# Patient Record
Sex: Female | Born: 1950
Health system: Southern US, Community
[De-identification: ages and names within clinical notes are randomized; demographics above are authoritative.]

## PROBLEM LIST (undated history)

## (undated) DIAGNOSIS — E785 Hyperlipidemia, unspecified: Secondary | ICD-10-CM

## (undated) DIAGNOSIS — M8580 Other specified disorders of bone density and structure, unspecified site: Secondary | ICD-10-CM

## (undated) DIAGNOSIS — E559 Vitamin D deficiency, unspecified: Secondary | ICD-10-CM

## (undated) DIAGNOSIS — R7303 Prediabetes: Secondary | ICD-10-CM

## (undated) DIAGNOSIS — E039 Hypothyroidism, unspecified: Secondary | ICD-10-CM

## (undated) HISTORY — DX: Hyperlipidemia, unspecified: E78.5

## (undated) HISTORY — PX: CATARACT EXTRACTION: SUR2

---
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
## 1986-10-18 HISTORY — PX: CHOLECYSTECTOMY: SHX55

## 2016-08-26 ENCOUNTER — Ambulatory Visit (INDEPENDENT_AMBULATORY_CARE_PROVIDER_SITE_OTHER): Payer: Medicare Other | Admitting: Family Medicine

## 2016-08-26 ENCOUNTER — Encounter: Payer: Self-pay | Admitting: Family Medicine

## 2016-08-26 VITALS — BP 122/76 | HR 70 | Ht 59.0 in | Wt 156.1 lb

## 2016-08-26 DIAGNOSIS — Z7689 Persons encountering health services in other specified circumstances: Secondary | ICD-10-CM | POA: Diagnosis not present

## 2016-08-26 DIAGNOSIS — Z8249 Family history of ischemic heart disease and other diseases of the circulatory system: Secondary | ICD-10-CM | POA: Insufficient documentation

## 2016-08-26 DIAGNOSIS — Z780 Asymptomatic menopausal state: Secondary | ICD-10-CM | POA: Diagnosis not present

## 2016-08-26 DIAGNOSIS — Z789 Other specified health status: Secondary | ICD-10-CM | POA: Diagnosis not present

## 2016-08-26 NOTE — Progress Notes (Signed)
Chief Complaint  Patient presents with  . New Patient (Initial Visit)    establishing care   65 year old woman Here because she turned 24 and medicare requires that she have a PCP Has not been to a doctor since she had her gallbladder removed in 1987. Says she had screening labs " at a van at [employer] in 2006" that were normal NEVER HAD: Pap smear, mammogram, colonoscopy Sees eye doctor infrequently, goes to dentist if she has a problem REFUSES immunization and does not recall her last shot She eats well, limited carbs and fried foods No reg exercise Never married, lives in family home with her brother her whole life.    Patient Active Problem List   Diagnosis Date Noted  . Health maintenance alteration 08/26/2016  . Family history of premature coronary heart disease 08/26/2016    No outpatient encounter prescriptions on file as of 08/26/2016.   No facility-administered encounter medications on file as of 08/26/2016.    History reviewed. No pertinent past medical history.  Past Surgical History:  Procedure Laterality Date  . CHOLECYSTECTOMY  10/1986    Social History   Social History  . Marital status: Single    Spouse name: N/A  . Number of children: 0  . 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 Smoking status: Never Smoker  . Smokeless tobacco: Never Used  . Alcohol use No  . Drug use: No  . Sexual activity: No     Comment: Never   Other Topics Concern  . Not on file   Social History Narrative   Never married   Lives with brother [name]       Family History  Problem Relation Age of Onset  . Atrial fibrillation Mother   . CVA Mother   . Stroke Mother   . Heart attack Father   . Heart disease Father 78    heart attack  . Other Brother     enlarge prostate  . Cancer Paternal Grandmother      head and neck    Review of Systems  Constitutional: Negative for chills, fever and weight loss.  HENT: Negative for congestion and hearing loss.   Eyes: Negative for blurred vision and pain.  Respiratory: Negative for cough and shortness of breath.   Cardiovascular: Negative for chest pain and leg swelling.  Gastrointestinal: Negative for abdominal pain, constipation, diarrhea and heartburn.  Genitourinary: Negative for dysuria and frequency.  Musculoskeletal: Negative for falls, joint pain and myalgias.  Neurological: Negative for dizziness, seizures and headaches.  Psychiatric/Behavioral: Negative for depression. The patient is not nervous/anxious and does not have insomnia.     BP 122/76 (BP Location: Left Arm, Patient Position: Sitting, Cuff Size: Large)   Pulse 70   Ht 4\' 11"  (1.499 m)   Wt 156 lb 1.9 oz (70.8 kg)   LMP  (Exact Date)   SpO2 94%   BMI 31.53 kg/m   Physical Exam  Constitutional: She is oriented to person, place, and time. She appears well-developed and well-nourished.  HENT:  Head: Normocephalic and atraumatic.  Right Ear: External ear normal.  Left Ear: External ear normal.  Mouth/Throat: Oropharynx is clear and moist.  Eyes: Conjunctivae are normal. Pupils are equal, round, and reactive to light.  Neck: Normal range of motion.  Neck supple. No thyromegaly present.  Cardiovascular: Normal rate, regular rhythm, normal heart sounds and intact distal pulses.   Pulmonary/Chest: Effort normal and breath sounds normal. No respiratory distress.  Abdominal: Soft. Bowel sounds are normal.  Musculoskeletal: Normal range of motion. She exhibits no edema.  Lymphadenopathy:    She has no cervical adenopathy.  Neurological: She is alert and oriented to person, place, and time. She has normal reflexes.  Gait normal  Skin: Skin is warm and dry.  Psychiatric: She has a normal mood and affect. Her behavior is normal. Thought content normal.  Nursing note and vitals  reviewed.  ASSESSMENT/PLAN:   1. Encounter to establish care with new doctor - COMPLETE METABOLIC PANEL WITH GFR - Lipid panel - VITAMIN D 25 Hydroxy (Vit-D Deficiency, Fractures)  2. Family history of premature coronary heart disease   3. Post-menopause   4. Health maintenance alteration  Discussed recommended health evaluation by the USPSTF guidelines and offered mammogram, colonoscopy, immunization update. She agrees only to labs today.     Patient Instructions  Lab tests today I will send you a letter with your test results.  If there is anything of concern, we will call right away.  Continue to eat well and get plenty of exercise Consider walking daily  See me for any problems or illness   Raylene Everts, MD

## 2016-08-26 NOTE — Patient Instructions (Signed)
Lab tests today I will send you a letter with your test results.  If there is anything of concern, we will call right away.  Continue to eat well and get plenty of exercise Consider walking daily  See me for any problems or illness

## 2016-08-27 LAB — COMPLETE METABOLIC PANEL WITH GFR
ALT: 16 U/L (ref 6–29)
AST: 19 U/L (ref 10–35)
Albumin: 3.8 g/dL (ref 3.6–5.1)
Alkaline Phosphatase: 53 U/L (ref 33–130)
BILIRUBIN TOTAL: 0.3 mg/dL (ref 0.2–1.2)
BUN: 12 mg/dL (ref 7–25)
CHLORIDE: 106 mmol/L (ref 98–110)
CO2: 27 mmol/L (ref 20–31)
CREATININE: 0.91 mg/dL (ref 0.50–0.99)
Calcium: 9.4 mg/dL (ref 8.6–10.4)
GFR, EST AFRICAN AMERICAN: 77 mL/min (ref >=60)
GFR, Est Non African American: 66 mL/min (ref >=60)
Glucose, Bld: 93 mg/dL (ref 65–99)
Potassium: 5.3 mmol/L (ref 3.5–5.3)
Sodium: 142 mmol/L (ref 135–146)
TOTAL PROTEIN: 6.8 g/dL (ref 6.1–8.1)

## 2016-08-27 LAB — LIPID PANEL
Cholesterol: 220 mg/dL — ABNORMAL HIGH (ref 125–200)
HDL: 43 mg/dL — ABNORMAL LOW (ref >=46)
LDL CALC: 127 mg/dL (ref <130)
TRIGLYCERIDES: 251 mg/dL — AB (ref <150)
Total CHOL/HDL Ratio: 5.1 Ratio — ABNORMAL HIGH (ref <=5.0)
VLDL: 50 mg/dL — AB (ref <30)

## 2016-08-28 ENCOUNTER — Encounter: Payer: Self-pay | Admitting: Family Medicine

## 2016-08-28 LAB — VITAMIN D 25 HYDROXY (VIT D DEFICIENCY, FRACTURES): VIT D 25 HYDROXY: 32 ng/mL (ref 30–100)

## 2017-03-31 ENCOUNTER — Ambulatory Visit: Payer: Medicare Other

## 2017-04-03 ENCOUNTER — Encounter: Payer: Self-pay | Admitting: Family Medicine

## 2017-04-03 ENCOUNTER — Ambulatory Visit (INDEPENDENT_AMBULATORY_CARE_PROVIDER_SITE_OTHER): Payer: Medicare Other | Admitting: Family Medicine

## 2017-04-03 VITALS — BP 146/80 | HR 80 | Temp 98.1°F | Resp 16 | Ht 59.0 in | Wt 163.0 lb

## 2017-04-03 DIAGNOSIS — Z1211 Encounter for screening for malignant neoplasm of colon: Secondary | ICD-10-CM

## 2017-04-03 DIAGNOSIS — Z780 Asymptomatic menopausal state: Secondary | ICD-10-CM

## 2017-04-03 DIAGNOSIS — Z23 Encounter for immunization: Secondary | ICD-10-CM

## 2017-04-03 DIAGNOSIS — Z1239 Encounter for other screening for malignant neoplasm of breast: Secondary | ICD-10-CM

## 2017-04-03 DIAGNOSIS — Z1231 Encounter for screening mammogram for malignant neoplasm of breast: Secondary | ICD-10-CM | POA: Diagnosis not present

## 2017-04-03 DIAGNOSIS — Z0000 Encounter for general adult medical examination without abnormal findings: Secondary | ICD-10-CM | POA: Diagnosis not present

## 2017-04-03 NOTE — Patient Instructions (Addendum)
Health Maintenance for Postmenopausal Women Menopause is a normal process in which your reproductive ability comes to an end. This process happens gradually over a span of months to years, usually between the ages of 58 and 40. Menopause is complete when you have missed 12 consecutive menstrual periods. It is important to talk with your health care provider about some of the most common conditions that affect postmenopausal women, such as heart disease, cancer, and bone loss (osteoporosis). Adopting a healthy lifestyle and getting preventive care can help to promote your health and wellness. Those actions can also lower your chances of developing some of these common conditions  What should I know about heart disease and stroke? Heart disease, heart attack, and stroke become more likely as you age. This may be due, in part, to the hormonal changes that your body experiences during menopause. These can affect how your body processes dietary fats, triglycerides, and cholesterol. Heart attack and stroke are both medical emergencies. There are many things that you can do to help prevent heart disease and stroke:  Have your blood pressure checked at least every 1-2 years. High blood pressure causes heart disease and increases the risk of stroke.  If you are 69-4 years old, ask your health care provider if you should take aspirin to prevent a heart attack or a stroke.  Do not use any tobacco products, including cigarettes, chewing tobacco, or electronic cigarettes. If you need help quitting, ask your health care provider.  It is important to eat a healthy diet and maintain a healthy weight.  Be sure to include plenty of vegetables, fruits, low-fat dairy products, and lean protein.  Avoid eating foods that are high in solid fats, added sugars, or salt (sodium).  Get regular exercise. This is one of the most important things that you can do for your health.  Try to exercise for at least 150 minutes  each week. The type of exercise that you do should increase your heart rate and make you sweat. This is known as moderate-intensity exercise.  Try to do strengthening exercises at least twice each week. Do these in addition to the moderate-intensity exercise.  Know your numbers.Ask your health care provider to check your cholesterol and your blood glucose. Continue to have your blood tested as directed by your health care provider. What should I know about cancer screening? There are several types of cancer. Take the following steps to reduce your risk and to catch any cancer development as early as possible. Breast Cancer  Practice breast self-awareness.  This means understanding how your breasts normally appear and feel.  It also means doing regular breast self-exams. Let your health care provider know about any changes, no matter how small.  If you are 81 or older, have a clinician do a breast exam (clinical breast exam or CBE) every year. Depending on your age, family history, and medical history, it may be recommended that you also have a yearly breast X-ray (mammogram).  If you have a family history of breast cancer, talk with your health care provider about genetic screening.  If you are at high risk for breast cancer, talk with your health care provider about having an MRI and a mammogram every year. Colorectal Cancer  This type of cancer can be detected and can often be prevented.  Routine colorectal cancer screening usually begins at age 82 and continues through age 24.  If you have risk factors for colon cancer, your health care provider may recommend  that you be screened at an earlier age.  If you have a family history of colorectal cancer, talk with your health care provider about genetic screening.  Your health care provider may also recommend using home test kits to check for hidden blood in your stool.  A small camera at the end of a tube can be used to examine your  colon directly (sigmoidoscopy or colonoscopy). This is done to check for the earliest forms of colorectal cancer.  Direct examination of the colon should be repeated every 5-10 years until age 19. However, if early forms of precancerous polyps or small growths are found or if you have a family history or genetic risk for colorectal cancer, you may need to be screened more often. Skin Cancer  Check your skin from head to toe regularly.  Monitor any moles. Be sure to tell your health care provider:  About any new moles or changes in moles, especially if there is a change in a mole's shape or color.  If you have a mole that is larger than the size of a pencil eraser.  If any of your family members has a history of skin cancer, especially at a young age, talk with your health care provider about genetic screening.  Always use sunscreen. Apply sunscreen liberally and repeatedly throughout the day.  Whenever you are outside, protect yourself by wearing long sleeves, pants, a wide-brimmed hat, and sunglasses. What should I know about osteoporosis? Osteoporosis is a condition in which bone destruction happens more quickly than new bone creation. After menopause, you may be at an increased risk for osteoporosis. To help prevent osteoporosis or the bone fractures that can happen because of osteoporosis, the following is recommended:  If you are 25-53 years old, get at least 1,000 mg of calcium and at least 600 mg of vitamin D per day.  If you are older than age 93 but younger than age 61, get at least 1,200 mg of calcium and at least 600 mg of vitamin D per day.  If you are older than age 50, get at least 1,200 mg of calcium and at least 800 mg of vitamin D per day. Smoking and excessive alcohol intake increase the risk of osteoporosis. Eat foods that are rich in calcium and vitamin D, and do weight-bearing exercises several times each week as directed by your health care provider. What should I  know about immunizations? It is important that you get and maintain your immunizations. These include:  Tetanus, diphtheria, and pertussis (Tdap) booster vaccine.  Influenza every year before the flu season begins.  Pneumonia vaccine.  Shingles vaccine.

## 2017-04-03 NOTE — Progress Notes (Signed)
Subjective:    Kaitlin Jones is a 66 y.o. female who presents for a Welcome to Medicare exam.   Review of Systems  Review of Systems  Constitutional: Negative for malaise/fatigue and weight loss.  HENT: Positive for congestion. Negative for hearing loss and sinus pain.        Dental fractures.  Pollen allergies  Eyes: Positive for blurred vision. Negative for double vision.  Respiratory: Positive for cough. Negative for sputum production and shortness of breath.        From PND  Cardiovascular: Negative for chest pain, palpitations, claudication and leg swelling.  Gastrointestinal: Positive for heartburn. Negative for blood in stool, constipation and diarrhea.       Once in a while  Genitourinary: Negative for dysuria and hematuria.  Musculoskeletal: Negative for back pain, joint pain and myalgias.  Neurological: Positive for dizziness. Negative for headaches.       Had a couple of dizzy (spinning spells)  Endo/Heme/Allergies: Positive for environmental allergies. Does not bruise/bleed easily.  Psychiatric/Behavioral: Negative for depression. The patient has insomnia. The patient is not nervous/anxious.       Objective:    Today's Vitals   04/03/17 1551  BP: (!) 146/80  Pulse: 80  Resp: 16  Temp: 98.1 F (36.7 C)  TempSrc: Temporal  SpO2: 96%  Weight: 163 lb 0.6 oz (74 kg)  Height: 4\' 11"  (1.499 m)  PainSc: 0-No pain  Body mass index is 32.93 kg/m.  Medications No outpatient encounter prescriptions on file as of 04/03/2017.   No facility-administered encounter medications on file as of 04/03/2017.      History: Past Medical History:  Diagnosis Date  . Hyperlipidemia    Past Surgical History:  Procedure Laterality Date  . CHOLECYSTECTOMY  10/1986    Family History  Problem Relation Age of Onset  . Atrial fibrillation Mother   . CVA Mother   . Stroke Mother   . Heart attack Father   . Heart disease Father 66       heart attack  . Other Brother    enlarge prostate  . Cancer Paternal Grandmother        head and neck   Social History   Occupational History  . nursing   . computers    Social History Main Topics  . Smoking status: Never Smoker  . Smokeless tobacco: Never Used  . Alcohol use No  . Drug use: No  . Sexual activity: No     Comment: Never    Tobacco Counseling Counseling given: Not Answered No tobacco use  Immunizations and Health Maintenance; immunizations refused  There is no immunization history on file for this patient. Health Maintenance Due  Topic Date Due  . Hepatitis C Screening  [birth year]  . HIV Screening  08/25/1966  . TETANUS/TDAP  08/25/1970  . PAP SMEAR  08/25/1972  . MAMMOGRAM  08/25/2001  . COLONOSCOPY  08/25/2001  . DEXA SCAN  08/25/2016  . PNA vac Low Risk Adult (1 of 2 - PCV13) 08/25/2016    Activities of Daily Living In your present state of health, do you have any difficulty performing the following activities: 04/03/2017  Hearing? N  Vision? Y  Difficulty concentrating or making decisions? N  Walking or climbing stairs? N  Dressing or bathing? N  Doing errands, shopping? N    Physical Exam  General appearance: cooperative and appears stated age Head: Normocephalic, without obvious abnormality, atraumatic Eyes: negative findings: lids and lashes normal and conjunctivae  and sclerae normal Ears: normal TM's and external ear canals both ears Nose: Nares normal. Septum midline. Mucosa normal. No drainage or sinus tenderness., no discharge Throat: normal findings: lips normal without lesions, buccal mucosa normal, oropharynx pink & moist without lesions or evidence of thrush and dental caries.  defect in mucosa with "hole" behind last upper molar on the right. Neck: no adenopathy and thyroid not enlarged, symmetric, no tenderness/mass/nodules Lungs: clear to auscultation bilaterally Heart: regular rate and rhythm Extremities: extremities normal, atraumatic, no cyanosis or  edema(  Advanced Directives:  would like attempt of CPR , does not want intubation or to be "Kept alive on machines, like a ventilator or feeding tube"    Assessment:    This is a routine wellness examination for this patient . Yes.  Not up to date with health testing due to previous refusal.  Vision/Hearing screen  Visual Acuity Screening   Right eye Left eye Both eyes  Without correction:     With correction: 20/20 20/40 20/20     Dietary issues and exercise activities discussed:   cereal - oatmeal - smoothie  Every day  NO exercise. 30 min of exercise daily recommended Balanced diet, diet sheet given to patient  Goals    . walk every day      Depression Screen PHQ 2/9 Scores 04/03/2017  PHQ - 2 Score 0     Fall Risk Fall Risk  04/03/2017  Falls in the past year? Yes  Number falls in past yr: 1  Injury with Fall? No    Cognitive Function:    normal memory - 3 recall and clock    Patient Care Team: Raylene Everts, MD as PCP - General (Family Medicine)     Plan:  1. Encounter for Medicare annual wellness exam  - MM Digital Screening; Future - DG Bone Density; Future - Cologuard  2. Screening for breast cancer   3. Screen for colon cancer   4.Need for pneumococcal vaccination prevnar 13 administered. Go to pharmacy for shingles Return in the fall for flu  5. Post-menopausal   I have personally reviewed and noted the following in the patient's chart:   . Medical and social history . Use of alcohol, tobacco or illicit drugs  . Current medications and supplements . Functional ability and status . Nutritional status . Physical activity . Advanced directives . List of other physicians . Hospitalizations, surgeries, and ER visits in previous 12 months . Vitals . Screenings to include cognitive, depression, and falls . Referrals and appointments  In addition, I have reviewed and discussed with patient certain preventive protocols, quality  metrics, and best practice recommendations. A written personalized care plan for preventive services as well as general preventive health recommendations were provided to patient.     Ova Freshwater, LPN 0/07/2329 Raylene Everts, MD

## 2017-04-07 ENCOUNTER — Telehealth: Payer: Self-pay | Admitting: Family Medicine

## 2017-04-07 NOTE — Telephone Encounter (Signed)
Left message w/ Shanon Brow - patients brother per [place] they would like to speak w/ her to schedule her mammo/bone density because we do not have information previous to oct of this year about her last mammo.  She is aware that she can call [phone] to schedule.

## 2017-04-15 DIAGNOSIS — Z1211 Encounter for screening for malignant neoplasm of colon: Secondary | ICD-10-CM | POA: Diagnosis not present

## 2017-04-15 DIAGNOSIS — Z1212 Encounter for screening for malignant neoplasm of rectum: Secondary | ICD-10-CM | POA: Diagnosis not present

## 2017-04-17 ENCOUNTER — Ambulatory Visit (HOSPITAL_COMMUNITY)
Admission: RE | Admit: 2017-04-17 | Discharge: 2017-04-17 | Disposition: A | Discharge status: Home / Self Care | Payer: Medicare Other | Source: Ambulatory Visit | Attending: Family Medicine | Admitting: Family Medicine

## 2017-04-17 DIAGNOSIS — Z0000 Encounter for general adult medical examination without abnormal findings: Secondary | ICD-10-CM

## 2017-04-17 DIAGNOSIS — M85851 Other specified disorders of bone density and structure, right thigh: Secondary | ICD-10-CM | POA: Insufficient documentation

## 2017-04-17 DIAGNOSIS — Z1231 Encounter for screening mammogram for malignant neoplasm of breast: Secondary | ICD-10-CM | POA: Insufficient documentation

## 2017-04-18 ENCOUNTER — Encounter: Payer: Self-pay | Admitting: Family Medicine

## 2017-04-21 ENCOUNTER — Telehealth: Payer: Self-pay

## 2017-04-21 NOTE — Telephone Encounter (Signed)
No.  We do lyme tests for symptoms only

## 2017-04-21 NOTE — Telephone Encounter (Signed)
Called Kaitlin Jones, aware of negative cologuard results. Also asking if she should have a lyme test as she had a tick in her scalp?

## 2017-04-22 NOTE — Telephone Encounter (Signed)
Called [place], aware.

## 2017-04-23 ENCOUNTER — Other Ambulatory Visit: Payer: Self-pay | Admitting: Family Medicine

## 2017-04-23 DIAGNOSIS — R928 Other abnormal and inconclusive findings on diagnostic imaging of breast: Secondary | ICD-10-CM

## 2017-04-29 ENCOUNTER — Ambulatory Visit (HOSPITAL_COMMUNITY)
Admission: RE | Admit: 2017-04-29 | Discharge: 2017-04-29 | Disposition: A | Discharge status: Home / Self Care | Payer: Medicare Other | Source: Ambulatory Visit | Attending: Family Medicine | Admitting: Family Medicine

## 2017-04-29 DIAGNOSIS — R928 Other abnormal and inconclusive findings on diagnostic imaging of breast: Secondary | ICD-10-CM

## 2017-04-29 DIAGNOSIS — N6489 Other specified disorders of breast: Secondary | ICD-10-CM | POA: Diagnosis not present

## 2017-06-03 DIAGNOSIS — H5203 Hypermetropia, bilateral: Secondary | ICD-10-CM | POA: Diagnosis not present

## 2017-06-03 DIAGNOSIS — H524 Presbyopia: Secondary | ICD-10-CM | POA: Diagnosis not present

## 2017-09-01 ENCOUNTER — Telehealth: Payer: Self-pay

## 2017-09-01 NOTE — Telephone Encounter (Signed)
Phone note made in error ?

## 2017-09-01 NOTE — Telephone Encounter (Signed)
Routing to Susan. 

## 2017-09-01 NOTE — Telephone Encounter (Signed)
Patient called asking to speak to [facility] or one of the other doctors. I told her that everyone was with patients and I could take a message. She said she had questions about a bill and how it was coded and someone needed to call her at [phone]

## 2017-09-11 ENCOUNTER — Ambulatory Visit (INDEPENDENT_AMBULATORY_CARE_PROVIDER_SITE_OTHER): Payer: Medicare Other

## 2017-09-11 DIAGNOSIS — Z23 Encounter for immunization: Secondary | ICD-10-CM

## 2017-10-30 ENCOUNTER — Telehealth: Payer: Self-pay | Admitting: Family Medicine

## 2017-10-30 NOTE — Telephone Encounter (Signed)
Pt stopped in to get there [employer] and I advised per [place] that we need to order some and when they were available we would call them to schedule.

## 2017-12-29 ENCOUNTER — Telehealth: Payer: Self-pay | Admitting: Family Medicine

## 2017-12-29 NOTE — Telephone Encounter (Signed)
Called Kaitlin Jones, will be coming in Thursday.

## 2017-12-29 NOTE — Telephone Encounter (Signed)
Patient called in to ask if she can get her 2nd shingles shot Cb#: [phone]

## 2018-01-01 ENCOUNTER — Ambulatory Visit (INDEPENDENT_AMBULATORY_CARE_PROVIDER_SITE_OTHER): Payer: Medicare Other

## 2018-01-01 DIAGNOSIS — Z23 Encounter for immunization: Secondary | ICD-10-CM

## 2018-01-01 NOTE — Progress Notes (Signed)
Injection given with no complications 

## 2018-04-23 ENCOUNTER — Encounter: Payer: Self-pay | Admitting: Family Medicine

## 2018-04-24 ENCOUNTER — Encounter: Payer: Self-pay | Admitting: Family Medicine

## 2018-06-09 DIAGNOSIS — H2513 Age-related nuclear cataract, bilateral: Secondary | ICD-10-CM | POA: Diagnosis not present

## 2018-07-22 DIAGNOSIS — R5383 Other fatigue: Secondary | ICD-10-CM | POA: Diagnosis not present

## 2018-07-22 DIAGNOSIS — E559 Vitamin D deficiency, unspecified: Secondary | ICD-10-CM | POA: Diagnosis not present

## 2018-07-22 DIAGNOSIS — E785 Hyperlipidemia, unspecified: Secondary | ICD-10-CM | POA: Diagnosis not present

## 2018-07-22 DIAGNOSIS — M8580 Other specified disorders of bone density and structure, unspecified site: Secondary | ICD-10-CM | POA: Diagnosis not present

## 2018-07-22 DIAGNOSIS — E119 Type 2 diabetes mellitus without complications: Secondary | ICD-10-CM | POA: Diagnosis not present

## 2018-07-22 DIAGNOSIS — Z131 Encounter for screening for diabetes mellitus: Secondary | ICD-10-CM | POA: Diagnosis not present

## 2018-09-07 DIAGNOSIS — E785 Hyperlipidemia, unspecified: Secondary | ICD-10-CM | POA: Diagnosis not present

## 2018-09-07 DIAGNOSIS — E039 Hypothyroidism, unspecified: Secondary | ICD-10-CM | POA: Diagnosis not present

## 2018-10-12 DIAGNOSIS — E039 Hypothyroidism, unspecified: Secondary | ICD-10-CM | POA: Diagnosis not present

## 2018-10-12 DIAGNOSIS — R7303 Prediabetes: Secondary | ICD-10-CM | POA: Diagnosis not present

## 2018-11-25 DIAGNOSIS — E030 Congenital hypothyroidism with diffuse goiter: Secondary | ICD-10-CM | POA: Diagnosis not present

## 2018-11-25 DIAGNOSIS — R7303 Prediabetes: Secondary | ICD-10-CM | POA: Diagnosis not present

## 2018-11-25 DIAGNOSIS — E039 Hypothyroidism, unspecified: Secondary | ICD-10-CM | POA: Diagnosis not present

## 2018-11-25 DIAGNOSIS — E785 Hyperlipidemia, unspecified: Secondary | ICD-10-CM | POA: Diagnosis not present

## 2018-11-25 DIAGNOSIS — M8580 Other specified disorders of bone density and structure, unspecified site: Secondary | ICD-10-CM | POA: Diagnosis not present

## 2019-01-29 ENCOUNTER — Encounter (INDEPENDENT_AMBULATORY_CARE_PROVIDER_SITE_OTHER): Payer: Self-pay | Admitting: Nurse Practitioner

## 2019-03-03 ENCOUNTER — Ambulatory Visit (INDEPENDENT_AMBULATORY_CARE_PROVIDER_SITE_OTHER): Payer: Medicare Other | Admitting: Nurse Practitioner

## 2019-04-13 ENCOUNTER — Other Ambulatory Visit (HOSPITAL_COMMUNITY): Payer: Self-pay | Admitting: Nurse Practitioner

## 2019-04-13 DIAGNOSIS — IMO0002 Reserved for concepts with insufficient information to code with codable children: Secondary | ICD-10-CM

## 2019-04-13 DIAGNOSIS — Z1231 Encounter for screening mammogram for malignant neoplasm of breast: Secondary | ICD-10-CM

## 2019-05-04 ENCOUNTER — Ambulatory Visit (HOSPITAL_COMMUNITY)
Admission: RE | Admit: 2019-05-04 | Discharge: 2019-05-04 | Disposition: A | Discharge status: Home / Self Care | Payer: Medicare Other | Source: Ambulatory Visit | Attending: Nurse Practitioner | Admitting: Nurse Practitioner

## 2019-05-04 ENCOUNTER — Ambulatory Visit (HOSPITAL_COMMUNITY): Payer: Medicare Other

## 2019-05-04 ENCOUNTER — Other Ambulatory Visit: Payer: Self-pay

## 2019-05-04 DIAGNOSIS — IMO0002 Reserved for concepts with insufficient information to code with codable children: Secondary | ICD-10-CM

## 2019-05-04 DIAGNOSIS — R928 Other abnormal and inconclusive findings on diagnostic imaging of breast: Secondary | ICD-10-CM | POA: Diagnosis not present

## 2019-08-10 ENCOUNTER — Encounter (INDEPENDENT_AMBULATORY_CARE_PROVIDER_SITE_OTHER): Payer: Self-pay | Admitting: Internal Medicine

## 2019-08-10 ENCOUNTER — Other Ambulatory Visit: Payer: Self-pay

## 2019-08-10 ENCOUNTER — Ambulatory Visit (INDEPENDENT_AMBULATORY_CARE_PROVIDER_SITE_OTHER): Payer: Medicare Other | Admitting: Internal Medicine

## 2019-08-10 ENCOUNTER — Telehealth (INDEPENDENT_AMBULATORY_CARE_PROVIDER_SITE_OTHER): Payer: Self-pay | Admitting: Internal Medicine

## 2019-08-10 VITALS — BP 110/67 | HR 66 | Temp 97.4°F | Resp 18 | Ht 59.0 in | Wt 112.0 lb

## 2019-08-10 DIAGNOSIS — Z0001 Encounter for general adult medical examination with abnormal findings: Secondary | ICD-10-CM

## 2019-08-10 DIAGNOSIS — R5381 Other malaise: Secondary | ICD-10-CM

## 2019-08-10 DIAGNOSIS — E782 Mixed hyperlipidemia: Secondary | ICD-10-CM

## 2019-08-10 DIAGNOSIS — Z1211 Encounter for screening for malignant neoplasm of colon: Secondary | ICD-10-CM

## 2019-08-10 DIAGNOSIS — M8580 Other specified disorders of bone density and structure, unspecified site: Secondary | ICD-10-CM

## 2019-08-10 DIAGNOSIS — E559 Vitamin D deficiency, unspecified: Secondary | ICD-10-CM | POA: Diagnosis not present

## 2019-08-10 DIAGNOSIS — E039 Hypothyroidism, unspecified: Secondary | ICD-10-CM

## 2019-08-10 DIAGNOSIS — R7303 Prediabetes: Secondary | ICD-10-CM | POA: Diagnosis not present

## 2019-08-10 DIAGNOSIS — Z1159 Encounter for screening for other viral diseases: Secondary | ICD-10-CM

## 2019-08-10 DIAGNOSIS — E785 Hyperlipidemia, unspecified: Secondary | ICD-10-CM | POA: Insufficient documentation

## 2019-08-10 DIAGNOSIS — R5383 Other fatigue: Secondary | ICD-10-CM

## 2019-08-10 HISTORY — DX: Vitamin D deficiency, unspecified: E55.9

## 2019-08-10 HISTORY — DX: Hypothyroidism, unspecified: E03.9

## 2019-08-10 HISTORY — DX: Hyperlipidemia, unspecified: E78.5

## 2019-08-10 HISTORY — DX: Prediabetes: R73.03

## 2019-08-10 HISTORY — DX: Other specified disorders of bone density and structure, unspecified site: M85.80

## 2019-08-10 NOTE — Progress Notes (Signed)
Chief Complaint: This 68 year old lady comes in for an annual physical exam and to address her chronic conditions which are described below. HPI: She has a history of hypothyroidism, hyperlipidemia, osteopenia, prediabetes and vitamin D deficiency. She takes desiccated thyroid for hypothyroidism without any problems. At one point, she was prediabetic but her last hemoglobin A1c was excellent at 5.3% but I tend to keep her surveillance on this. As far as her osteopenia is concerned, she walks only once a week and does not seem to do any weightbearing exercises.  Thankfully, she has not developed frank osteoporosis that we know of. She continues to take vitamin D3 supplementation for vitamin D deficiency. Thankfully, for her hyperlipidemia, she has not required statin therapy.  There is no history of coronary artery disease or cerebrovascular disease.  She denies any chest pain, dyspnea, palpitations or limb weakness.  Past Medical History:  Diagnosis Date  . HLD (hyperlipidemia) 08/10/2019  . Hyperlipidemia   . Hypothyroidism, adult 08/10/2019  . Osteopenia 08/10/2019  . Prediabetes 08/10/2019  . Vitamin D deficiency disease 08/10/2019   Past Surgical History:  Procedure Laterality Date  . CHOLECYSTECTOMY  10/1986     Social History   Social History Narrative   Never married   Lives with brother [name].   Retired Marine scientist.        Allergies: No Known Allergies   Current Meds  Medication Sig  . Cholecalciferol (VITAMIN D3) 50 MCG (2000 UT) TABS Take 2 tablets by mouth daily.  Marland Kitchen thyroid (NP THYROID) 60 MG tablet Take 1 tablet by mouth daily.     Nutrition She tries to eat healthy, although I am not sure how consistent this is.  Sleep Sleep is reasonable between 6 to 8 hours a night.  Exercise Walks 3 miles once a week.  [id number] from the symptoms mentioned above,there are no other symptoms referable to all systems reviewed.  Physical Exam: Blood pressure 110/67, pulse  66, temperature (!) 97.4 F (36.3 C), temperature source Temporal, resp. rate 18, height 4\' 11"  (1.499 m), weight 112 lb (50.8 kg), last menstrual period 11/18/2004, SpO2 99 %. Vitals with BMI 08/10/2019 04/03/2017 08/26/2016  Height 4\' 11"  4\' 11"  4\' 11"   Weight 112 lbs 163 lbs 1 oz 156 lbs 2 oz  BMI [id number]  Systolic [id number]  Diastolic 67 80 76  Pulse 66 80 70      She looks systemically well. General: Alert, cooperative, and appears to be the stated age.No pallor.  No jaundice.  No clubbing. Head: Normocephalic Eyes: Sclera white, pupils equal and reactive to light, red reflex x 2,  Ears: Normal bilaterally Oral cavity: Lips, mucosa, and tongue normal: Teeth and gums normal Neck: No adenopathy, supple, symmetrical, trachea midline, and thyroid does not appear enlarged Breast: No masses felt. Respiratory: Clear to auscultation bilaterally.No wheezing, crackles or bronchial breathing. Cardiovascular: Heart sounds are present and appear to be normal without murmurs or added sounds.  No carotid bruits.  Peripheral pulses are present and equal bilaterally.: Soft, nontender, Gastrointestinal:positive bowel sounds, no hepatosplenomegaly.  No masses felt.No tenderness. Skin: Clear, No rashes noted.No worrisome skin lesions seen. Neurological: Grossly intact without focal findings, cranial nerves II through XII intact, muscle strength equal bilaterally Musculoskeletal: No acute joint abnormalities noted.Full range of movement noted with joints. Psychiatric: Affect appropriate, non-anxious.    Assessment  1. Colon cancer screening   2. Hypothyroidism, adult   3. Vitamin D deficiency disease   4. Prediabetes  5. Mixed hyperlipidemia   6. Encounter for hepatitis C screening test for low risk patient   7. Osteopenia, unspecified location     Tests Ordered:  Orders Placed This Encounter  Procedures  . Ambulatory referral to Gastroenterology     Plan  1. She will continue  with all medications for chronic conditions above. 2. Healthy 68 year old for her age. 3. Blood work as ordered above. 4. I will refer her to gastroenterology for colonoscopy. 5. Further recommendations will depend on blood results and I will have a follow-up with her with Judson Roch in about 3 months time. 6. Today, in addition to a preventative visit, I performed an office visit to address her chronic conditions above.        Nimish C Gosrani   08/10/2019, 10:46 AM

## 2019-08-11 ENCOUNTER — Encounter (INDEPENDENT_AMBULATORY_CARE_PROVIDER_SITE_OTHER): Payer: Self-pay | Admitting: Internal Medicine

## 2019-08-11 LAB — COMPLETE METABOLIC PANEL WITH GFR
AG Ratio: 1.3 (calc) (ref 1.0–2.5)
ALT: 18 U/L (ref 6–29)
AST: 22 U/L (ref 10–35)
Albumin: 4 g/dL (ref 3.6–5.1)
Alkaline phosphatase (APISO): 76 U/L (ref 37–153)
BUN: 9 mg/dL (ref 7–25)
CO2: 27 mmol/L (ref 20–32)
Calcium: 9.8 mg/dL (ref 8.6–10.4)
Chloride: 105 mmol/L (ref 98–110)
Creat: 0.75 mg/dL (ref 0.50–0.99)
GFR, Est African American: 96 mL/min/{1.73_m2} (ref >=60)
GFR, Est Non African American: 82 mL/min/{1.73_m2} (ref >=60)
Globulin: 3.1 g/dL (calc) (ref 1.9–3.7)
Glucose, Bld: 75 mg/dL (ref 65–99)
Potassium: 3.8 mmol/L (ref 3.5–5.3)
Sodium: 143 mmol/L (ref 135–146)
Total Bilirubin: 0.3 mg/dL (ref 0.2–1.2)
Total Protein: 7.1 g/dL (ref 6.1–8.1)

## 2019-08-11 LAB — CBC
HCT: 41.2 % (ref 35.0–45.0)
Hemoglobin: 13.4 g/dL (ref 11.7–15.5)
MCH: 29.5 pg (ref 27.0–33.0)
MCHC: 32.5 g/dL (ref 32.0–36.0)
MCV: 90.7 fL (ref 80.0–100.0)
MPV: 10.5 fL (ref 7.5–12.5)
Platelets: 305 10*3/uL (ref 140–400)
RBC: 4.54 10*6/uL (ref 3.80–5.10)
RDW: 12 % (ref 11.0–15.0)
WBC: 6.9 10*3/uL (ref 3.8–10.8)

## 2019-08-11 LAB — HEMOGLOBIN A1C
Hgb A1c MFr Bld: 5 % of total Hgb (ref <5.7)
Mean Plasma Glucose: 97 (calc)
eAG (mmol/L): 5.4 (calc)

## 2019-08-11 LAB — HEPATITIS C ANTIBODY
Hepatitis C Ab: NONREACTIVE
SIGNAL TO CUT-OFF: 0.03 (ref <1.00)

## 2019-08-11 LAB — VITAMIN D 25 HYDROXY (VIT D DEFICIENCY, FRACTURES): Vit D, 25-Hydroxy: 97 ng/mL (ref 30–100)

## 2019-08-11 LAB — TSH: TSH: <0.01 mIU/L — ABNORMAL LOW (ref 0.40–4.50)

## 2019-08-11 LAB — LIPID PANEL
Cholesterol: 171 mg/dL (ref <200)
HDL: 55 mg/dL (ref >=50)
LDL Cholesterol (Calc): 96 mg/dL (calc)
Non-HDL Cholesterol (Calc): 116 mg/dL (calc) (ref <130)
Total CHOL/HDL Ratio: 3.1 (calc) (ref <5.0)
Triglycerides: 108 mg/dL (ref <150)

## 2019-08-11 LAB — T3, FREE: T3, Free: 5.5 pg/mL — ABNORMAL HIGH (ref 2.3–4.2)

## 2019-08-11 NOTE — Telephone Encounter (Signed)
Done

## 2019-08-12 ENCOUNTER — Encounter: Payer: Self-pay | Admitting: Internal Medicine

## 2019-09-16 ENCOUNTER — Other Ambulatory Visit: Payer: Self-pay

## 2019-09-16 ENCOUNTER — Ambulatory Visit (INDEPENDENT_AMBULATORY_CARE_PROVIDER_SITE_OTHER): Payer: Self-pay | Admitting: *Deleted

## 2019-09-16 DIAGNOSIS — Z1211 Encounter for screening for malignant neoplasm of colon: Secondary | ICD-10-CM

## 2019-09-16 MED ORDER — PEG 3350-KCL-NA BICARB-NACL 420 G PO SOLR: 4000.0000 mL | Freq: Once | ORAL | 0 refills | Status: AC

## 2019-09-16 NOTE — Progress Notes (Signed)
Appropriate.

## 2019-09-16 NOTE — Addendum Note (Signed)
Addended by: Metro Kung on: 09/16/2019 01:54 PM   Modules accepted: Orders, SmartSet

## 2019-09-16 NOTE — Progress Notes (Signed)
Gastroenterology Pre-Procedure Review  Request Date: 09/16/2019 Requesting Physician: Dr. Anastasio Champion, no previous TCS  PATIENT REVIEW QUESTIONS: The patient responded to the following health history questions as indicated:    1. Diabetes Melitis: no 2. Joint replacements in the past 12 months: no 3. Major health problems in the past 3 months: no 4. Has an artificial valve or MVP: no 5. Has a defibrillator: no 6. Has been advised in past to take antibiotics in advance of a procedure like teeth cleaning: no 7. Family history of colon cancer: no  8. Alcohol Use: no 9. Illicit drug Use: no 10. History of sleep apnea: no  11. History of coronary artery or other vascular stents placed within the last 12 months: no 12. History of any prior anesthesia complications: no 13. There is no height or weight on file to calculate BMI. ht: 4'11 wt: 110 lbs    MEDICATIONS & ALLERGIES:    Patient reports the following regarding taking any blood thinners:   Plavix? no Aspirin? no Coumadin? no Brilinta? no Xarelto? no Eliquis? no Pradaxa? no Savaysa? no Effient? no  Patient confirms/reports the following medications:  Current Outpatient Medications  Medication Sig Dispense Refill  . Calcium Carbonate-Vit D-Min (CALCIUM 1200 PO) Take by mouth daily.    . Cholecalciferol (VITAMIN D3) 50 MCG (2000 UT) TABS Take 2 tablets by mouth daily.    Marland Kitchen thyroid (NP THYROID) 60 MG tablet Take 1 tablet by mouth daily.     No current facility-administered medications for this visit.     Patient confirms/reports the following allergies:  No Known Allergies  No orders of the defined types were placed in this encounter.   AUTHORIZATION INFORMATION Primary Insurance: [facility] Medicare,  ID #: [id number]  Group #: [id number] Pre-Cert / Josem Kaufmann required: No, not required  SCHEDULE INFORMATION: Procedure has been scheduled as follows:  Date: 11/15/2019, Time: 12:30  Location: [facility] with Dr. Oneida Alar  This Gastroenterology  Pre-Precedure Review Form is being routed to the following provider(s): Roseanne Kaufman, NP

## 2019-09-16 NOTE — Patient Instructions (Addendum)
[place]   Please notify us immediately if you are diabetic, take iron supplements, or if you are on coumadin or any blood thinners.   Patient Name: Kaitlin Jones Date of procedure: 03/06/2020 Time to register at [facility]: 7:30 am Provider: Dr. Oneida Alar   Purchase: MIRALAX 238 gram bottle, 1 FLEET ENEMA, 1 box of DULCOLAX (All over the counter medications)    03/04/2020-- 2 Days prior to procedure: START CLEAR LIQUID DIET AFTER YOUR LUNCH MEAL--NO SOLID FOODS!   03/05/2020-- 1 Day prior to procedure:   CLEAR LIQUIDS ALL DAY--NO SOLID FOODS!   Diabetic medication adjustments for today:    At 10:00 AM, take 2 DULCOLAX 21m tablets   At 12:00 PM, Mix 5 teaspoons of Miralax in any 4-6 ounces of CLEAR LIQUIDS (Gatorade) every hour for 5 hours until passing clear, watery stools. Be sure to drink 4 ounces of clear liquid 30 minutes after each dose of Miralax.   At 3:00 PM, take 2 Dulcolax 537mtablets   If stools are not clear & watery by 6:00 PM, take 5 teaspoons of Miralax every 30 minutes until stools are clear (no color)   You must have a complete prep to ensure the most effective cleaning.   CONTINUE CLEAR LIQUIDS ONLY UNTIL MIDNIGHT. Make a conscious effort to drink as much as you can before, during & after the preparation.    NOTHING TO EAT OR DRINK AFTER MIDNIGHT except for your heart, blood pressure & breathing medications. You may take them with a sip of clear liquids.    03/06/2020-- Day of Procedure  Give yourself one Fleet enema about 1 hour prior to leaving for the hospital.   Diabetic medication adjustments for today:   You may take TYLENOL products. Please continue your regular medications unless we have instructed you otherwise.    Please note, on the day of your procedure you MUST be accompanied by an adult who is willing to assume responsibility for you at time of discharge. If you do not have such person with you, your  procedure will have to be rescheduled.                                                             Please leave ALL jewelry at home prior to coming to the hospital for your procedure.   *It is your responsibility to check with your insurance company for the benefits of coverage you have for this procedure. Unfortunately, not all insurance companies have benefits to cover all or part of these types of procedures. It is your responsibility to check your benefits, however we will be glad to assist you with any codes your insurance company may need.   Please note that most insurance companies will not cover a screening colonoscopy for people under the age of 5038For example, with some insurance companies you may have benefits for a screening colonoscopy, but if polyps are found the diagnosis will change and then you may have a deductible that will need to be met. Please make sure you check your benefits for screening colonoscopy as well as a diagnostic colonoscopy.    CLEAR LIQUIDS: (NO RED)  Jello Apple Juice White Grape Juice Water  Banana popsicles Kool-Aid Coffee(No cream or milk)  Tea (  No cream or milk) Soft drinks Broth (fat free beef/chicken/vegetable)   Clear liquids allow you to see your fingers on the other side of the glass. Be sure they are NOT RED in color, cloudy, but CLEAR.   Do Not Eat:  Dairy products of any kind Cranberry juice  Tomato or V8 Juice Orange Juice  Grapefruit Juice Red Grape Juice  Solid foods like cereal, oatmeal, yogurt, fruits, vegetables, creamed soups, eggs, bread, etc   HELPFUL HINTS TO MAKE DRINKING EASIER:  -Make sure prep is extremely COLD. Refrigerate the night before. You may also put in freezer.  -You may try mixing Crystal Light or Country Time Lemonade if you prefer. MIx in small amounts. Add more if necessary.  -Trying drinking through a straw.  -Rinse mouth with water or mouthwash  between glasses to remove aftertaste.  -Try sipping on a cold beverage/ice popsicles between glasses of prep.  -Place a piece of sugar-free hard candy in mouth between glasses.  -If you become nauseated, try consuming smaller amounts or stretch out the time between glasses. Stop for 30 minutes to an hour & slowly start back drinking.   Call our office with any questions or concerns at [phone].   Thank You

## 2019-10-08 ENCOUNTER — Telehealth: Payer: Self-pay | Admitting: *Deleted

## 2019-10-08 NOTE — Telephone Encounter (Signed)
Spoke with pt and made her aware that her Covid screening date needs to be changed to 11/13/2019.  Scheduled her for 11/13/2019 at 8:30.  I will mail her out confirmation of the new appt.  Scanned Covid testing information including new appt date and time into Epic.

## 2019-10-19 ENCOUNTER — Telehealth (INDEPENDENT_AMBULATORY_CARE_PROVIDER_SITE_OTHER): Payer: Self-pay | Admitting: Internal Medicine

## 2019-10-19 ENCOUNTER — Other Ambulatory Visit (INDEPENDENT_AMBULATORY_CARE_PROVIDER_SITE_OTHER): Payer: Self-pay | Admitting: Internal Medicine

## 2019-10-19 MED ORDER — THYROID 60 MG PO TABS: 60.0000 mg | ORAL_TABLET | Freq: Every day | ORAL | 3 refills | Status: DC

## 2019-10-20 NOTE — Telephone Encounter (Signed)
Done

## 2019-10-25 ENCOUNTER — Other Ambulatory Visit (INDEPENDENT_AMBULATORY_CARE_PROVIDER_SITE_OTHER): Payer: Self-pay | Admitting: Internal Medicine

## 2019-10-25 ENCOUNTER — Telehealth (INDEPENDENT_AMBULATORY_CARE_PROVIDER_SITE_OTHER): Payer: Self-pay

## 2019-10-25 MED ORDER — THYROID 60 MG PO TABS: 60.0000 mg | ORAL_TABLET | Freq: Every day | ORAL | 0 refills | Status: DC

## 2019-10-25 NOTE — Telephone Encounter (Signed)
I have sent a 90-day supply to [facility] in [place].

## 2019-10-27 ENCOUNTER — Other Ambulatory Visit (INDEPENDENT_AMBULATORY_CARE_PROVIDER_SITE_OTHER): Payer: Self-pay | Admitting: Internal Medicine

## 2019-10-27 ENCOUNTER — Telehealth (INDEPENDENT_AMBULATORY_CARE_PROVIDER_SITE_OTHER): Payer: Self-pay | Admitting: Internal Medicine

## 2019-10-27 NOTE — Telephone Encounter (Signed)
Please let the patient know that I sent this prescription to [facility] for 90-day supply 2 days ago

## 2019-11-09 ENCOUNTER — Other Ambulatory Visit: Payer: Self-pay

## 2019-11-09 ENCOUNTER — Encounter (INDEPENDENT_AMBULATORY_CARE_PROVIDER_SITE_OTHER): Payer: Self-pay | Admitting: Nurse Practitioner

## 2019-11-09 ENCOUNTER — Ambulatory Visit (INDEPENDENT_AMBULATORY_CARE_PROVIDER_SITE_OTHER): Payer: Medicare Other | Admitting: Nurse Practitioner

## 2019-11-09 VITALS — BP 120/70 | HR 68 | Temp 97.6°F | Resp 18 | Ht 59.0 in | Wt 112.0 lb

## 2019-11-09 DIAGNOSIS — R05 Cough: Secondary | ICD-10-CM

## 2019-11-09 DIAGNOSIS — J9801 Acute bronchospasm: Secondary | ICD-10-CM | POA: Diagnosis not present

## 2019-11-09 DIAGNOSIS — E039 Hypothyroidism, unspecified: Secondary | ICD-10-CM | POA: Diagnosis not present

## 2019-11-09 DIAGNOSIS — R059 Cough, unspecified: Secondary | ICD-10-CM

## 2019-11-09 DIAGNOSIS — R7303 Prediabetes: Secondary | ICD-10-CM

## 2019-11-09 DIAGNOSIS — J310 Chronic rhinitis: Secondary | ICD-10-CM

## 2019-11-09 DIAGNOSIS — E559 Vitamin D deficiency, unspecified: Secondary | ICD-10-CM

## 2019-11-09 DIAGNOSIS — M8580 Other specified disorders of bone density and structure, unspecified site: Secondary | ICD-10-CM

## 2019-11-09 DIAGNOSIS — E782 Mixed hyperlipidemia: Secondary | ICD-10-CM

## 2019-11-09 LAB — T4, FREE: Free T4: 1.2 ng/dL (ref 0.8–1.8)

## 2019-11-09 LAB — T3, FREE: T3, Free: 5 pg/mL — ABNORMAL HIGH (ref 2.3–4.2)

## 2019-11-09 LAB — TSH: TSH: <0.01 mIU/L — ABNORMAL LOW (ref 0.40–4.50)

## 2019-11-09 MED ORDER — ALBUTEROL SULFATE HFA 108 (90 BASE) MCG/ACT IN AERS: 2.0000 | INHALATION_SPRAY | Freq: Four times a day (QID) | RESPIRATORY_TRACT | 1 refills | Status: DC | PRN

## 2019-11-09 MED ORDER — FLUTICASONE PROPIONATE 50 MCG/ACT NA SUSP: 2.0000 | Freq: Every day | NASAL | 6 refills | Status: DC

## 2019-11-09 NOTE — Assessment & Plan Note (Signed)
She will continue on her current supplementation dose.  Consider collecting serum level again at next office visit.

## 2019-11-09 NOTE — Assessment & Plan Note (Signed)
Magda Paganini panel was within normal limits, I congratulated her on this and she will continue with her current lifestyle measures to keep this controlled.

## 2019-11-09 NOTE — Assessment & Plan Note (Signed)
Last A1c is now within the normal range.  I congratulated her on this.  She will continue with her lifestyle measures to keep this controlled

## 2019-11-09 NOTE — Progress Notes (Signed)
Subjective:  Patient ID: Kaitlin Jones, female    DOB: 1951-06-30  Age: 68 y.o. MRN: [id number]  CC:  Chief Complaint  Patient presents with  . Follow-up      HPI  This patient presents today for follow-up of chronic conditions.  Osteopenia: She continues on vitamin D and calcium supplementation.  She tells me she tries to eat a lot of leafy green vegetables during the day.  She is also active in her home and does a lot of heavy housework.  Vitamin D deficiency: Last serum level was 97 collected in September of this year..  She continues on her vitamin D supplement.  Hyperlipidemia: She is currently not taking any medication for this, but is controlling her cholesterol with lifestyle measures.  Last lipid panel collected in September 2020 was within normal limits.  Prediabetes: Last A1c was collected in September of this year and was 5.0.  She is currently controlled with lifestyle measures.  Hypothyroidism: She is currently on NP thyroid 60mg  daily.  She tells me she is tolerating this dose well.  Last thyroid panel was collected in September of this year.   Past Medical History:  Diagnosis Date  . HLD (hyperlipidemia) 08/10/2019  . Hyperlipidemia   . Hypothyroidism, adult 08/10/2019  . Osteopenia 08/10/2019  . Prediabetes 08/10/2019  . Vitamin D deficiency disease 08/10/2019      Family History  Problem Relation Age of Onset  . Atrial fibrillation Mother   . CVA Mother   . Stroke Mother   . Heart attack Father   . Heart disease Father 56       heart attack  . Other Brother        enlarge prostate  . Cancer Paternal Grandmother        head and neck    Social History   Social History Narrative   Never married   Lives with brother [name].   Retired Marine scientist.   Social History   Tobacco Use  . Smoking status: Never Smoker  . Smokeless tobacco: Never Used  Substance Use Topics  . Alcohol use: No     No outpatient medications have been marked as taking for  the 11/09/19 encounter (Office Visit) with Ailene Ards, NP.    ROS:  Review of Systems  Constitutional: Negative for fever and malaise/fatigue.  Eyes: Negative for blurred vision.  Respiratory: Negative for cough, shortness of breath and wheezing.   Cardiovascular: Negative for chest pain and palpitations.  Gastrointestinal: Negative for abdominal pain, blood in stool and diarrhea.  Skin:       (-) hair loss  Neurological: Negative for dizziness, sensory change, weakness and headaches.  Psychiatric/Behavioral: Negative for depression. The patient is not nervous/anxious.      Objective:   Today's Vitals: BP 120/70 (BP Location: Right Arm, Patient Position: Sitting, Cuff Size: Normal)   Pulse 68   Temp 97.6 F (36.4 C) (Temporal)   Resp 18   Ht 4\' 11"  (1.499 m)   Wt 112 lb (50.8 kg)   LMP 11/18/2004 (Exact Date)   SpO2 98% Comment: wearing mask  BMI 22.62 kg/m  Vitals with BMI 11/09/2019 08/10/2019 04/03/2017  Height 4\' 11"  4\' 11"  4\' 11"   Weight 112 lbs 112 lbs 163 lbs 1 oz  BMI [id number] 33  Systolic [id number]  Diastolic 70 67 80  Pulse 68 66 80     Physical Exam Vitals reviewed.  Constitutional:  General: She is not in acute distress.    Appearance: Normal appearance.  HENT:     Head: Normocephalic and atraumatic.  Neck:     Vascular: No carotid bruit.  Cardiovascular:     Rate and Rhythm: Normal rate and regular rhythm.     Pulses: Normal pulses.     Heart sounds: Normal heart sounds.  Pulmonary:     Effort: Pulmonary effort is normal.     Breath sounds: Normal breath sounds.  Skin:    General: Skin is warm and dry.  Neurological:     General: No focal deficit present.     Mental Status: She is alert and oriented to person, place, and time.  Psychiatric:        Mood and Affect: Mood normal.        Behavior: Behavior normal.        Judgment: Judgment normal.          Assessment   No diagnosis found.    Tests ordered No orders of  the defined types were placed in this encounter.    Plan: Please see assessment and plan per problem list below.   No orders of the defined types were placed in this encounter.   Patient to follow-up in 3 months.  Ailene Ards, NP

## 2019-11-09 NOTE — Patient Instructions (Signed)
Thank you for choosing [facility] as your medical provider! If you have any questions or concerns regarding your health care, please do not hesitate to call our office.  Continue taking all medication as prescribed.  Continue trying to stay active in your home especially doing weightbearing exercises to help protect your bones.  Use the nasal spray every day for approximately 1 month to see if this helps with your nasal congestion, also use the inhaler as needed for your coughing fits.  Make sure you undergo Covid testing prior to your colonoscopy.  Please follow-up as scheduled in 3 months. We look forward to seeing you again soon! Have a great Christmas!!  At [facility] we value your feedback. You may receive a survey about your visit today. Please share your experience as we strive to create trusting relationships with our patients to provide genuine, compassionate, quality care.  We appreciate your understanding and patience as we review any laboratory studies, imaging, and other diagnostic tests that are ordered as we care for you. We do our best to address any and all results in a timely manner. If you do not hear about test results within 1 week, please do not hesitate to contact us. If we referred you to a specialist during your visit or ordered imaging testing, contact the office if you have not been contacted to be scheduled within 1 weeks.  We also encourage the use of MyChart, which contains your medical information for your review as well. If you are not enrolled in this feature, an access code is on this after visit summary for your convenience. Thank you for allowing [place] to be involved in your care.

## 2019-11-09 NOTE — Assessment & Plan Note (Signed)
This patient is being treated with desiccated thyroid for hypothyroidism.  The patient has been counseled regarding side effects and how to deal with them. I will collect thyroid panel for further evaluation today.

## 2019-11-09 NOTE — Assessment & Plan Note (Signed)
She was encouraged to continue taking her vitamin D3 and calcium supplementations.  I also encouraged her to continue participating in weightbearing activities.

## 2019-11-10 ENCOUNTER — Encounter (INDEPENDENT_AMBULATORY_CARE_PROVIDER_SITE_OTHER): Payer: Self-pay | Admitting: Nurse Practitioner

## 2019-11-11 ENCOUNTER — Other Ambulatory Visit (HOSPITAL_COMMUNITY): Payer: Medicare Other

## 2019-11-13 ENCOUNTER — Other Ambulatory Visit (HOSPITAL_COMMUNITY)
Admission: RE | Admit: 2019-11-13 | Discharge: 2019-11-13 | Disposition: A | Discharge status: Home / Self Care | Payer: Medicare Other | Source: Ambulatory Visit | Attending: Gastroenterology | Admitting: Gastroenterology

## 2019-11-13 ENCOUNTER — Other Ambulatory Visit: Payer: Self-pay

## 2019-11-13 DIAGNOSIS — Z20828 Contact with and (suspected) exposure to other viral communicable diseases: Secondary | ICD-10-CM | POA: Insufficient documentation

## 2019-11-13 DIAGNOSIS — Z01812 Encounter for preprocedural laboratory examination: Secondary | ICD-10-CM | POA: Diagnosis present

## 2019-11-13 LAB — SARS CORONAVIRUS 2 (TAT 6-24 HRS): SARS Coronavirus 2: NEGATIVE

## 2019-11-15 ENCOUNTER — Telehealth: Payer: Self-pay

## 2019-11-15 NOTE — Telephone Encounter (Signed)
VM was left 11/11/2019 @ 9:56 AM, Pt wasn't able to get her prep for her procedure scheduled for 11/16/2019 with [facility] @ 11:45 AM.   Called pt 11/15/2019 @ 8:15 AM, pt states she wasn't able to get her prep and the pharmacy called as well. Pt needs to R/S her procedure for today 11/15/2019 with [facility].   Routing message to AS

## 2019-11-15 NOTE — Telephone Encounter (Signed)
Opened in error

## 2019-11-15 NOTE — Telephone Encounter (Signed)
Spoke with pt and she rescheduled her procedure to 03/06/2020 and scheduled her Covid screening to 03/03/2020.  Pt is aware that I will mail her out new prep instructions.  Kim in [facility] notified.

## 2020-01-18 ENCOUNTER — Other Ambulatory Visit: Payer: Self-pay

## 2020-01-18 ENCOUNTER — Emergency Department (HOSPITAL_COMMUNITY)
Admission: EM | Admit: 2020-01-18 | Discharge: 2020-01-19 | Disposition: A | Discharge status: Home / Self Care | Payer: Medicare Other | Attending: Emergency Medicine | Admitting: Emergency Medicine

## 2020-01-18 ENCOUNTER — Emergency Department (HOSPITAL_COMMUNITY): Payer: Medicare Other

## 2020-01-18 ENCOUNTER — Encounter (HOSPITAL_COMMUNITY): Payer: Self-pay | Admitting: Emergency Medicine

## 2020-01-18 DIAGNOSIS — E039 Hypothyroidism, unspecified: Secondary | ICD-10-CM | POA: Diagnosis not present

## 2020-01-18 DIAGNOSIS — R569 Unspecified convulsions: Secondary | ICD-10-CM | POA: Insufficient documentation

## 2020-01-18 DIAGNOSIS — R4182 Altered mental status, unspecified: Secondary | ICD-10-CM | POA: Diagnosis present

## 2020-01-18 DIAGNOSIS — Z79899 Other long term (current) drug therapy: Secondary | ICD-10-CM | POA: Insufficient documentation

## 2020-01-18 LAB — CBC WITH DIFFERENTIAL/PLATELET
Abs Immature Granulocytes: 0.02 10*3/uL (ref 0.00–0.07)
Basophils Absolute: 0 10*3/uL (ref 0.0–0.1)
Basophils Relative: 1 %
Eosinophils Absolute: 0.2 10*3/uL (ref 0.0–0.5)
Eosinophils Relative: 3 %
HCT: 40.2 % (ref 36.0–46.0)
Hemoglobin: 12.8 g/dL (ref 12.0–15.0)
Immature Granulocytes: 0 %
Lymphocytes Relative: 29 %
Lymphs Abs: 1.7 10*3/uL (ref 0.7–4.0)
MCH: 29.2 pg (ref 26.0–34.0)
MCHC: 31.8 g/dL (ref 30.0–36.0)
MCV: 91.8 fL (ref 80.0–100.0)
Monocytes Absolute: 0.4 10*3/uL (ref 0.1–1.0)
Monocytes Relative: 7 %
Neutro Abs: 3.4 10*3/uL (ref 1.7–7.7)
Neutrophils Relative %: 60 %
Platelets: 292 10*3/uL (ref 150–400)
RBC: 4.38 MIL/uL (ref 3.87–5.11)
RDW: 12.4 % (ref 11.5–15.5)
WBC: 5.7 10*3/uL (ref 4.0–10.5)
nRBC: 0 % (ref 0.0–0.2)

## 2020-01-18 LAB — COMPREHENSIVE METABOLIC PANEL
ALT: 25 U/L (ref 0–44)
AST: 32 U/L (ref 15–41)
Albumin: 3.7 g/dL (ref 3.5–5.0)
Alkaline Phosphatase: 55 U/L (ref 38–126)
Anion gap: 8 (ref 5–15)
BUN: 13 mg/dL (ref 8–23)
CO2: 27 mmol/L (ref 22–32)
Calcium: 8.9 mg/dL (ref 8.9–10.3)
Chloride: 102 mmol/L (ref 98–111)
Creatinine, Ser: 0.86 mg/dL (ref 0.44–1.00)
GFR calc Af Amer: >60 mL/min (ref >60)
GFR calc non Af Amer: >60 mL/min (ref >60)
Glucose, Bld: 125 mg/dL — ABNORMAL HIGH (ref 70–99)
Potassium: 4.2 mmol/L (ref 3.5–5.1)
Sodium: 137 mmol/L (ref 135–145)
Total Bilirubin: 0.3 mg/dL (ref 0.3–1.2)
Total Protein: 6.9 g/dL (ref 6.5–8.1)

## 2020-01-18 LAB — ETHANOL: Alcohol, Ethyl (B): <10 mg/dL (ref <10)

## 2020-01-18 LAB — URINALYSIS, ROUTINE W REFLEX MICROSCOPIC
Bacteria, UA: NONE SEEN
Bilirubin Urine: NEGATIVE
Glucose, UA: NEGATIVE mg/dL
Hgb urine dipstick: NEGATIVE
Ketones, ur: NEGATIVE mg/dL
Nitrite: NEGATIVE
Protein, ur: NEGATIVE mg/dL
Specific Gravity, Urine: 1.008 (ref 1.005–1.030)
pH: 7 (ref 5.0–8.0)

## 2020-01-18 LAB — RAPID URINE DRUG SCREEN, HOSP PERFORMED
Amphetamines: NOT DETECTED
Barbiturates: NOT DETECTED
Benzodiazepines: NOT DETECTED
Cocaine: NOT DETECTED
Opiates: NOT DETECTED
Tetrahydrocannabinol: NOT DETECTED

## 2020-01-18 MED ORDER — SODIUM CHLORIDE 0.9 % IV BOLUS
500.0000 mL | Freq: Once | INTRAVENOUS | Status: AC
  Administered 2020-01-18: 500 mL via INTRAVENOUS

## 2020-01-18 NOTE — Discharge Instructions (Addendum)
Call Dr. Merlene Laughter, the neurologist for an appointment to be seen about the seizure.  Make sure you are taking your usual medications.  Also make sure you are getting plenty of rest and drinking a lot of fluids.  Do not bathe in a bathtub, operate machinery including driving, or place yourself in a position where you could be hurt if you have another seizure.  Do not drive or operate heavy machinery for at least 6 months after having a seizure.

## 2020-01-18 NOTE — ED Triage Notes (Signed)
Pt arrives via [facility] after brother found her after a nap gasping for air. Pt confused on scene and slightly combative, no hx of same.

## 2020-01-18 NOTE — ED Notes (Signed)
Pt ambulatory with no difficulty or distress noted. 

## 2020-01-18 NOTE — ED Provider Notes (Signed)
Care assumed from Dr. Eulis Foster.  Urine sample is unrevealing for infection.  Culture is sent.  Brother at bedside confirms patient is at baseline.  Patient appears stable for discharge per plan of Dr. Eulis Foster.  She will follow up with neurology.  Discussed driving restrictions for 6 months given her new onset seizure. EKG is sinus rhythm with T wave inversions and LVH. Troponin negative. Patient tolerating PO and ambulatory. At baseline per brother at bedside.  Denies chest pain or shortness of breath.  Follow-up with neurology and PCP.  Return precautions discussed.   EKG Interpretation  Date/Time:  Wednesday January 19 2020 00:06:32 EST Ventricular Rate:  82 PR Interval:    QRS Duration: 87 QT Interval:  379 QTC Calculation: 443 R Axis:   -22 Text Interpretation: Sinus rhythm LVH with secondary repolarization abnormality Anterior Q waves, possibly due to LVH No significant change was found Confirmed by Ezequiel Essex [phone]) on 01/19/2020 12:20:06 AM          Ezequiel Essex, MD 01/19/20 [id number]

## 2020-01-18 NOTE — ED Provider Notes (Signed)
[facility] EMERGENCY DEPARTMENT Provider Note   CSN: [id number] Arrival date & time: 01/18/20  2020     History Chief Complaint  Patient presents with  . Altered Mental Status    Kaitlin Jones is a 69 y.o. female.  HPI Patient transferred here by EMS after her brother who lives with her, heard her "gurgling," and she was less responsive than usual. EMS noted blood on her shirt, and a small abrasion of the right side of her tongue. Patient does not know why she is here cannot remember anything about what happened. She is a poor historian however she does attempt to answer questions.  Level 5 caveat-altered mental status    Past Medical History:  Diagnosis Date  . HLD (hyperlipidemia) 08/10/2019  . Hyperlipidemia   . Hypothyroidism, adult 08/10/2019  . Osteopenia 08/10/2019  . Prediabetes 08/10/2019  . Vitamin D deficiency disease 08/10/2019    Patient Active Problem List   Diagnosis Date Noted  . Hypothyroidism, adult 08/10/2019  . Vitamin D deficiency disease 08/10/2019  . Prediabetes 08/10/2019  . HLD (hyperlipidemia) 08/10/2019  . Osteopenia 08/10/2019  . Family history of premature coronary heart disease 08/26/2016    Past Surgical History:  Procedure Laterality Date  . CHOLECYSTECTOMY  10/1986     OB History   No obstetric history on file.     Family History  Problem Relation Age of Onset  . Atrial fibrillation Mother   . CVA Mother   . Stroke Mother   . Heart attack Father   . Heart disease Father 74       heart attack  . Other Brother        enlarge prostate  . Cancer Paternal Grandmother        head and neck    Social History   Tobacco Use  . Smoking status: Never Smoker  . Smokeless tobacco: Never Used  Substance Use Topics  . Alcohol use: No  . Drug use: No    Home Medications Prior to Admission medications   Medication Sig Start Date End Date Taking? Authorizing Provider  Calcium Carbonate-Vit D-Min (CALCIUM 1200 PO) Take 1 tablet by  mouth daily.    Yes [provider]  Cholecalciferol (VITAMIN D3) 50 MCG (2000 UT) TABS Take 1 capsule by mouth daily.    Yes [provider]  thyroid (NP THYROID) 60 MG tablet Take 1 tablet (60 mg total) by mouth daily. 10/25/19  Yes Gosrani, Nimish C, MD  albuterol (VENTOLIN HFA) 108 (90 Base) MCG/ACT inhaler Inhale 2 puffs into the lungs every 6 (six) hours as needed for wheezing or shortness of breath. 11/09/19   Ailene Ards, NP    Allergies    Patient has no known allergies.  Review of Systems   Review of Systems  Unable to perform ROS: Mental status change    Physical Exam Updated Vital Signs BP 132/61   Pulse 97   Temp 97.8 F (36.6 C) (Axillary)   Resp 17   Ht 5\' 2"  (1.575 m)   Wt 59 kg   LMP 11/18/2004 (Exact Date)   SpO2 99%   BMI 23.78 kg/m   Physical Exam Vitals and nursing note reviewed.  Constitutional:      General: She is not in acute distress.    Appearance: She is well-developed. She is not ill-appearing or toxic-appearing.  HENT:     Head: Normocephalic and atraumatic.     Right Ear: External ear normal.  Left Ear: External ear normal.     Nose: Nose normal.     Mouth/Throat:     Mouth: Mucous membranes are moist.     Pharynx: No oropharyngeal exudate or posterior oropharyngeal erythema.     Comments: Nonbleeding abrasion, right anterior tongue. Eyes:     Conjunctiva/sclera: Conjunctivae normal.     Pupils: Pupils are equal, round, and reactive to light.  Neck:     Trachea: Phonation normal.  Cardiovascular:     Rate and Rhythm: Normal rate and regular rhythm.     Heart sounds: Normal heart sounds.  Pulmonary:     Effort: Pulmonary effort is normal.     Breath sounds: Normal breath sounds.  Abdominal:     General: There is no distension.     Palpations: Abdomen is soft.     Tenderness: There is no abdominal tenderness.  Musculoskeletal:        General: Normal range of motion.     Cervical back: Normal range of motion  and neck supple.  Skin:    General: Skin is warm and dry.  Neurological:     Mental Status: She is alert.     Cranial Nerves: No cranial nerve deficit.     Sensory: No sensory deficit.     Motor: No abnormal muscle tone.     Coordination: Coordination normal.     Comments: No dysarthria or aphasia. No pronator drift. Normal strength arms and legs bilaterally.  Psychiatric:        Mood and Affect: Mood normal.        Behavior: Behavior normal.     ED Results / Procedures / Treatments   Labs (all labs ordered are listed, but only abnormal results are displayed) Labs Reviewed  COMPREHENSIVE METABOLIC PANEL - Abnormal; Notable for the following components:      Result Value   Glucose, Bld 125 (*)    All other components within normal limits  CBC WITH DIFFERENTIAL/PLATELET  ETHANOL  RAPID URINE DRUG SCREEN, HOSP PERFORMED  URINALYSIS, ROUTINE W REFLEX MICROSCOPIC    EKG EKG Interpretation  Date/Time:  Tuesday January 18 2020 20:26:36 EST Ventricular Rate:  99 PR Interval:    QRS Duration: 89 QT Interval:  366 QTC Calculation: 470 R Axis:   5 Text Interpretation: Sinus rhythm Left ventricular hypertrophy Anterior Q waves, possibly due to LVH No old tracing to compare Confirmed by Daleen Bo [phone]) on 01/18/2020 9:23:28 PM   Radiology DG Chest 2 View  Result Date: 01/18/2020 CLINICAL DATA:  Seizure EXAM: CHEST - 2 VIEW COMPARISON:  None. FINDINGS: No consolidation, features of edema, pneumothorax, or effusion. Pulmonary vascularity is normally distributed. The aorta is calcified. The remaining cardiomediastinal contours are unremarkable. No acute osseous or soft tissue abnormality. Multiple surgical clips noted in the upper abdomen. Degenerative changes are present in the imaged spine and shoulders. IMPRESSION: No acute cardiopulmonary abnormality. Aortic Atherosclerosis (ICD10-I70.0). Electronically Signed   By: Lovena Le M.D.   On: 01/18/2020 21:17   CT Head Wo  Contrast  Result Date: 01/18/2020 CLINICAL DATA:  Seizure, nontraumatic EXAM: CT HEAD WITHOUT CONTRAST TECHNIQUE: Contiguous axial images were obtained from the base of the skull through the vertex without intravenous contrast. COMPARISON:  None. FINDINGS: Brain: Hypoattenuation in the bilateral temporal poles likely related to streak artifact seen on sagittal reconstruction. No convincing CT evidence of acute infarction, hemorrhage, hydrocephalus, extra-axial collection or mass lesion/mass effect. Symmetric prominence of the ventricles, cisterns and sulci compatible with parenchymal  volume loss. Patchy areas of white matter hypoattenuation are most compatible with chronic microvascular angiopathy. Vascular: Atherosclerotic calcification of the carotid siphons and intradural vertebral arteries. No hyperdense vessel. Skull: No calvarial fracture or suspicious osseous lesion. No scalp swelling or hematoma. Few benign-appearing soft tissue mineralizations in the supraorbital scalp. Sinuses/Orbits: Minimal thickening in the ethmoidal air cells. Remaining paranasal sinuses are predominantly clear without visible air-fluid levels. Middle ear cavities are clear. Included orbital structures are unremarkable. Other: Moderate bilateral temporomandibular joint osteoarthrosis. IMPRESSION: 1. No acute intracranial findings. 2. Features of mild chronic microvascular angiopathy and parenchymal volume loss. 3. Moderate bilateral temporomandibular joint osteoarthrosis. Electronically Signed   By: Lovena Le M.D.   On: 01/18/2020 21:28    Procedures Procedures (including critical care time)  Medications Ordered in ED Medications  sodium chloride 0.9 % bolus 500 mL (0 mLs Intravenous Stopped 01/18/20 2229)    ED Course  I have reviewed the triage vital signs and the nursing notes.  Pertinent labs & imaging results that were available during my care of the patient were reviewed by me and considered in my medical decision  making (see chart for details).  Clinical Course as of Jan 17 2321  Tue Jan 18, 2020  2123 Normal  CBC with Differential [EW]  2123 Normal except glucose high  Comprehensive metabolic panel(!) [EW]  0000000 Normal  Ethanol [EW]  2124 No acute abnormality, interpreted by me  CT Head Wo Contrast [EW]  2155 Attempted to contact patient's brother with whom she lives, no one answered.  I left a message for him to return the call.   [EW]  2256 Patient's brother now in the room with her, he states that she is almost back to normal.  He describes seeing her have a seizure, with tonic-clonic activity for about 1 minute, and then sleepiness afterwards, during which time she was transferred to the ED.  He has not seen her have a seizure previously.  She was recently started on thyroid medicine for her hypothyroidism.   [EW]    Clinical Course User Index [EW] Daleen Bo, MD   MDM Rules/Calculators/A&P                       Patient Vitals for the past 24 hrs:  BP Temp Temp src Pulse Resp SpO2 Height Weight  01/18/20 2300 132/61 -- -- -- 17 -- -- --  01/18/20 2230 136/63 -- -- -- (!) 21 -- -- --  01/18/20 2130 (!) 143/79 -- -- 97 (!) 24 99 % -- --  01/18/20 2030 (!) 147/66 -- -- (!) 102 18 98 % -- --  01/18/20 2026 (!) 148/72 97.8 F (36.6 C) Axillary (!) 101 20 94 % -- --  01/18/20 2023 -- -- -- -- -- -- 5\' 2"  (1.575 m) 59 kg    11:22 PM Reevaluation with update and discussion. After initial assessment and treatment, an updated evaluation reveals at this time she is brighter alert, no dysarthria or aphasia.  Findings discussed with the patient and her brother, all questions answered. Daleen Bo   Medical Decision Making: Seizure, first time without complications or signs for causality.  Screening testing done.  No apparent metabolic instability.  Patient recently started on Synthroid, for hypothyroidism.  Doubt relation to seizure today.  Rebecah Matura was evaluated in Emergency  Department on 01/18/2020 for the symptoms described in the history of present illness. She was evaluated in the context of the global COVID-19 pandemic,  which necessitated consideration that the patient might be at risk for infection with the SARS-CoV-2 virus that causes COVID-19. Institutional protocols and algorithms that pertain to the evaluation of patients at risk for COVID-19 are in a state of rapid change based on information released by regulatory bodies including the CDC and federal and state organizations. These policies and algorithms were followed during the patient's care in the ED.   CRITICAL CARE- no Performed by: Daleen Bo   Nursing Notes Reviewed/ Care Coordinated Applicable Imaging Reviewed Interpretation of Laboratory Data incorporated into ED treatment  The patient appears reasonably screened and/or stabilized for discharge and I doubt any other medical condition or other [facility] requiring further screening, evaluation, or treatment in the ED at this time prior to discharge.  Plan: Home Medications-continue usual; Home Treatments-rest, fluids; return here if the recommended treatment, does not improve the symptoms; Recommended follow up-neurology follow-up regarding first-time seizure.    Final Clinical Impression(s) / ED Diagnoses Final diagnoses:  Seizure [facility])    Rx / [place] Orders ED Discharge Orders    None       Daleen Bo, MD 01/22/20 [phone]

## 2020-01-19 LAB — TROPONIN I (HIGH SENSITIVITY): Troponin I (High Sensitivity): 5 ng/L (ref <18)

## 2020-01-20 ENCOUNTER — Telehealth (INDEPENDENT_AMBULATORY_CARE_PROVIDER_SITE_OTHER): Payer: Self-pay

## 2020-01-20 LAB — URINE CULTURE: Culture: NO GROWTH

## 2020-01-20 NOTE — Telephone Encounter (Signed)
Make sure that she has a follow up appointment with me or Judson Roch in the next 1-2 weeks. Thanks

## 2020-01-20 NOTE — Telephone Encounter (Signed)
Pt called to notify that she has had a seizure. Pt was home. Waiting for a return call back to let us know the timing of seizure and any falls, head injury.

## 2020-01-24 ENCOUNTER — Other Ambulatory Visit (INDEPENDENT_AMBULATORY_CARE_PROVIDER_SITE_OTHER): Payer: Self-pay | Admitting: Internal Medicine

## 2020-01-24 ENCOUNTER — Other Ambulatory Visit (INDEPENDENT_AMBULATORY_CARE_PROVIDER_SITE_OTHER): Payer: Self-pay

## 2020-01-24 ENCOUNTER — Telehealth: Payer: Self-pay | Admitting: *Deleted

## 2020-01-24 ENCOUNTER — Telehealth: Payer: Self-pay | Admitting: Gastroenterology

## 2020-01-24 DIAGNOSIS — R569 Unspecified convulsions: Secondary | ICD-10-CM

## 2020-01-24 NOTE — Telephone Encounter (Signed)
Spoke with pt and she is aware that [facility] is out of her prep kit.  Informed her about Miralax prep.  Pt aware that I will mail her out prep instructions.  Pt voiced understanding.

## 2020-01-24 NOTE — Progress Notes (Signed)
Just ordered the consult with Dr. Merlene Laughter.

## 2020-01-24 NOTE — Telephone Encounter (Signed)
Pt returning call. [phone]

## 2020-01-24 NOTE — Telephone Encounter (Signed)
Appt set on 3/23

## 2020-01-24 NOTE — Telephone Encounter (Signed)
[facility] sent fax over stating that they were out of prep kit ([employer]).  Tried to call pt to inform her about Miralax prep.  LMOM for pt to call me back.

## 2020-01-24 NOTE — Progress Notes (Unsigned)
Pt called to see if we would send a referral to Dr Merlene Laughter. Per Dr Merlene Laughter office has to have a referral entered.

## 2020-02-08 ENCOUNTER — Ambulatory Visit (INDEPENDENT_AMBULATORY_CARE_PROVIDER_SITE_OTHER): Payer: Medicare Other | Admitting: Internal Medicine

## 2020-02-08 ENCOUNTER — Other Ambulatory Visit: Payer: Self-pay

## 2020-02-08 ENCOUNTER — Encounter (INDEPENDENT_AMBULATORY_CARE_PROVIDER_SITE_OTHER): Payer: Self-pay | Admitting: Internal Medicine

## 2020-02-08 VITALS — BP 120/70 | HR 79 | Temp 98.3°F | Ht 59.0 in | Wt 113.4 lb

## 2020-02-08 DIAGNOSIS — E559 Vitamin D deficiency, unspecified: Secondary | ICD-10-CM

## 2020-02-08 DIAGNOSIS — E039 Hypothyroidism, unspecified: Secondary | ICD-10-CM | POA: Diagnosis not present

## 2020-02-08 DIAGNOSIS — R569 Unspecified convulsions: Secondary | ICD-10-CM

## 2020-02-08 DIAGNOSIS — E782 Mixed hyperlipidemia: Secondary | ICD-10-CM

## 2020-02-08 DIAGNOSIS — R7303 Prediabetes: Secondary | ICD-10-CM

## 2020-02-08 NOTE — Progress Notes (Signed)
Metrics: Intervention Frequency ACO  Documented Smoking Status Yearly  Screened one or more times in 24 months  Cessation Counseling or  Active cessation medication Past 24 months  Past 24 months   Guideline developer: UpToDate (See UpToDate for funding source) Date Released: 2014       Wellness Office Visit  Subjective:  Patient ID: Kaitlin Jones, female    DOB: 1951/05/27  Age: 69 y.o. MRN: [id number]  CC: This lady comes in for follow-up of hypothyroidism, hyperlipidemia, prediabetes. HPI  She recently had an episode and went to the emergency room approximately 3 weeks ago.  It was felt that she had a seizure and the description of this sounds like a seizure also with loss of consciousness, shaking of limbs and confusion postictally.  She has not seen a neurologist yet but she has been told not to drive for 6 months for the time being.  She has had no further episodes since this time.  CT scan of the brain did not show any major abnormalities. She continues on desiccated thyroid without any problems. Past Medical History:  Diagnosis Date  . HLD (hyperlipidemia) 08/10/2019  . Hyperlipidemia   . Hypothyroidism, adult 08/10/2019  . Osteopenia 08/10/2019  . Prediabetes 08/10/2019  . Vitamin D deficiency disease 08/10/2019      Family History  Problem Relation Age of Onset  . Atrial fibrillation Mother   . CVA Mother   . Stroke Mother   . Heart attack Father   . Heart disease Father 80       heart attack  . Other Brother        enlarge prostate  . Cancer Paternal Grandmother        head and neck    Social History   Social History Narrative   Never married   Lives with brother [name].   Retired Marine scientist.   Social History   Tobacco Use  . Smoking status: Never Smoker  . Smokeless tobacco: Never Used  Substance Use Topics  . Alcohol use: No    Current Meds  Medication Sig  . Calcium Carbonate-Vit D-Min (CALCIUM 1200 PO) Take 1 tablet by mouth daily.   . Cholecalciferol  (VITAMIN D3) 50 MCG (2000 UT) TABS Take 1 capsule by mouth daily.   Marland Kitchen thyroid (NP THYROID) 60 MG tablet Take 1 tablet (60 mg total) by mouth daily.       Objective:   Today's Vitals: BP 120/70 (BP Location: Left Arm, Patient Position: Sitting, Cuff Size: Normal)   Pulse 79   Temp 98.3 F (36.8 C) (Temporal)   Ht 4\' 11"  (1.499 m)   Wt 113 lb 6.4 oz (51.4 kg)   LMP 11/18/2004 (Exact Date)   SpO2 93%   BMI 22.90 kg/m  Vitals with BMI 02/08/2020 01/19/2020 01/19/2020  Height 4\' 11"  - -  Weight 113 lbs 6 oz - -  BMI 123456 - -  Systolic 123456 [id number]  Diastolic 70 52 51  Pulse 79 - -     Physical Exam  She looks systemically well.  Her weight is stable.  Blood pressure is well controlled.  She is alert and orientated without any focal neurological signs.     Assessment   1. Seizure ([place])   2. Hypothyroidism, adult   3. Vitamin D deficiency disease   4. Prediabetes   5. Mixed hyperlipidemia       Tests ordered Orders Placed This Encounter  Procedures  . Ambulatory referral to Neurology  Plan: 1. I will refer to neurology now for further evaluation. 2. She will continue with all other medications in the meantime and I do not think we need to do any blood work. 3. Follow-up with Judson Roch in about 3 months time.   No orders of the defined types were placed in this encounter.   Doree Albee, MD

## 2020-02-15 NOTE — Progress Notes (Signed)
[facility]    Provider:  Dr Jaynee Eagles Requesting Provider: Hurshel Party MD Primary Care Provider:  Ailene Ards, NP  CC:  seizure  HPI:  Kaitlin Jones is a 69 y.o. female here as requested by Dr. Anastasio Champion for seizure.  Past medical history hypothyroidism, hyperlipidemia, prediabetes.  I reviewed Dr. Lanice Shirts notes, patient recently had an episode of went to the emergency room approximately 3 weeks prior to her last appointment (last seen February 08, 2020) she had loss of consciousness, shaking of limbs and confusion postictally.  She was told not to drive for 6 months.  No further episodes.  CT of the head did not show any major abnormalities.  I also reviewed emergency room notes from January 18, 2020, patient arrived by EMS after her brother who lives with her heard her gurgling, she was less responsive than usual, she had blood in her shirt and a small abrasion on the right side of her tongue, patient did not know why she was in the emergency room and could not remember anything that happened, she was a poor historian however she did attempt to answer some questions.  Brother described her seizure as tonic-clonic activity for about 1 minute then sleepiness afterwards, he had never seen her have a seizure previously, she was recently started on thyroid medication for hypothyroidism.  I reviewed labs which showed unremarkable CBC, CMP, ethanol was negative, urine did not show infection, culture showed no growth, urine rapid drug screen was negative, she was discharged home when stable.  Patient was in her usual state of health, she had been doing house work, no illness, not sick, no fever, no changes in medications, nothing unusual had happened. She laid down to rest and she doesn't remember anything, her brother who is here provides information, he heard some labored breathing and he saw her eyes were rolled back in her head, she bit her tongue, she was unconscious and had turned white,  he called 911 and EMS took her to the hospital while she was fighting them and she doesn't remember.She remembers the ED. She has never had a seizure in the past. No family history of seizures. No episodes since then. No other focal neurologic deficits, associated symptoms, inciting events or modifiable factors.  Reviewed notes, labs and imaging from outside physicians, which showed:  T4 Free normal  Personally reviewed CT head 01/18/2020 and agree with the following: IMPRESSION: 1. No acute intracranial findings. 2. Features of mild chronic microvascular angiopathy and parenchymal volume loss.  Review of Systems: Patient complains of symptoms per HPI as well as the following symptoms: seizure, memory changes since seizure. Pertinent negatives and positives per HPI. All others negative.   Social History   Socioeconomic History  . Marital status: Single    Spouse name: Not on file  . Number of children: 0  . Years of education: 61  . Highest education level: Not on file  Occupational History  . Occupation: nursing  . Occupation: computers  Tobacco Use  . Smoking status: Never Smoker  . Smokeless tobacco: Never Used  Substance and Sexual Activity  . Alcohol use: No  . Drug use: No  . Sexual activity: Never    Comment: Never  Other Topics Concern  . Not on file  Social History Narrative   Never married   Lives with brother [name].   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    Social Determinants of Health   Financial Resource Strain:   .  Difficulty of Paying Living Expenses:   Food Insecurity:   . Worried About Charity fundraiser in the Last Year:   . Arboriculturist in the Last Year:   Transportation Needs:   . Film/video editor (Medical):   Marland Kitchen Lack of Transportation (Non-Medical):   Physical Activity:   . Days of Exercise per Week:   . Minutes of Exercise per Session:   Stress:   . Feeling of Stress :   Social Connections:   . Frequency of Communication with  Friends and Family:   . Frequency of Social Gatherings with Friends and Family:   . Attends Religious Services:   . Active Member of Clubs or Organizations:   . Attends Archivist Meetings:   Marland Kitchen Marital Status:   Intimate Partner Violence:   . Fear of Current or Ex-Partner:   . Emotionally Abused:   Marland Kitchen Physically Abused:   . Sexually Abused:     Family History  Problem Relation Age of Onset  . Atrial fibrillation Mother   . CVA Mother   . Stroke Mother   . Heart attack Father   . Heart disease Father 74       heart attack  . Other Brother        enlarge prostate  . Cancer Paternal Grandmother        head and neck  . Seizures Neg Hx     Past Medical History:  Diagnosis Date  . HLD (hyperlipidemia) 08/10/2019  . Hyperlipidemia   . Hypothyroidism, adult 08/10/2019  . Osteopenia 08/10/2019  . Prediabetes 08/10/2019  . Vitamin D deficiency disease 08/10/2019    Patient Active Problem List   Diagnosis Date Noted  . Hypothyroidism, adult 08/10/2019  . Vitamin D deficiency disease 08/10/2019  . Prediabetes 08/10/2019  . HLD (hyperlipidemia) 08/10/2019  . Osteopenia 08/10/2019  . Family history of premature coronary heart disease 08/26/2016    Past Surgical History:  Procedure Laterality Date  . CHOLECYSTECTOMY  10/1986    Current Outpatient Medications  Medication Sig Dispense Refill  . Calcium Carb-Cholecalciferol (CALCIUM + D3 PO) Take by mouth daily. Calcium 200 mg plus Vitamin D3 25 mcg    . Cholecalciferol (VITAMIN D3) 50 MCG (2000 UT) TABS Take 2 capsules by mouth daily.     Marland Kitchen thyroid (NP THYROID) 60 MG tablet Take 1 tablet (60 mg total) by mouth daily. 90 tablet 0  . levETIRAcetam (KEPPRA) 500 MG tablet Take 1 tablet (500 mg total) by mouth every 12 (twelve) hours. 60 tablet 3   No current facility-administered medications for this visit.    Allergies as of 02/16/2020  . (No Known Allergies)    Vitals: BP (!) 143/72 (BP Location: Left Arm, Patient  Position: Sitting)   Pulse 69   Temp 98.1 F (36.7 C) Comment: taken at front  Ht 4\' 11"  (1.499 m)   Wt 115 lb (52.2 kg)   LMP 11/18/2004 (Exact Date)   BMI 23.23 kg/m  Last Weight:  Wt Readings from Last 1 Encounters:  02/16/20 115 lb (52.2 kg)   Last Height:   Ht Readings from Last 1 Encounters:  02/16/20 4\' 11"  (1.499 m)     Physical exam: Exam: Gen: NAD, conversant, well nourised, well groomed                     CV: RRR, no MRG. No Carotid Bruits. No peripheral edema, warm, nontender Eyes: Conjunctivae clear without exudates or  hemorrhage  Neuro: Detailed Neurologic Exam  Speech:    Speech is normal; fluent and spontaneous with normal comprehension.  Cognition:    The patient is oriented to person, place, and time;     recent and remote memory intact;     language fluent;     normal attention, concentration,     fund of knowledge Cranial Nerves:    The pupils are equal, round, and reactive to light. The fundi are flat. Visual fields are full to finger confrontation. Extraocular movements are intact. Trigeminal sensation is intact and the muscles of mastication are normal. The face is symmetric. The palate elevates in the midline. Hearing intact. Voice is normal. Shoulder shrug is normal. The tongue has normal motion without fasciculations.   Coordination:    No dysmetria   Gait:  normal native gait  Motor Observation:    No asymmetry, no atrophy, and no involuntary movements noted. Tone:    Normal muscle tone.    Posture:    Posture is normal. normal erect    Strength:    Strength is V/V in the upper and lower limbs.      Sensation: intact to LT     Reflex Exam:  DTR's:    Deep tendon reflexes in the upper and lower extremities are normal bilaterally.   Toes:    The toes are equiv bilaterally.   Clonus:    Clonus is absent.    Assessment/Plan:  Absolutely lovely patient with an unprovoked generalized seizure, need complete workup and recommend  AED.   EEG here in the office nad if that is negative we will do an extended EEG likely 72 hours at home Start Keppra for seizure prevention  Discussed: Per [facility] statutes, patients with seizures are not allowed to drive until they have been seizure-free for six months.    Use caution when using heavy equipment or power tools. Avoid working on ladders or at heights. Take showers instead of baths. Ensure the water temperature is not too high on the home water heater. Do not go swimming alone. Do not lock yourself in a room alone (i.e. bathroom). When caring for infants or small children, sit down when holding, feeding, or changing them to minimize risk of injury to the child in the event you have a seizure. Maintain good sleep hygiene. Avoid alcohol.    If patient has another seizure, call 911 and bring them back to the ED if: A.  The seizure lasts longer than 5 minutes.      B.  The patient doesn't wake shortly after the seizure or has new problems such as difficulty seeing, speaking or moving following the seizure C.  The patient was injured during the seizure D.  The patient has a temperature over 102 F (39C) E.  The patient vomited during the seizure and now is having trouble breathing  Per [facility] statutes, patients with seizures are not allowed to drive until they have been seizure-free for six months.  Other recommendations include using caution when using heavy equipment or power tools. Avoid working on ladders or at heights. Take showers instead of baths.  Do not swim alone.  Ensure the water temperature is not too high on the home water heater. Do not go swimming alone. Do not lock yourself in a room alone (i.e. bathroom). When caring for infants or small children, sit down when holding, feeding, or changing them to minimize risk of injury to the child in  the event you have a seizure. Maintain good sleep hygiene. Avoid alcohol.  Also recommend adequate sleep,  hydration, good diet and minimize stress.  During the Seizure  - First, ensure adequate ventilation and place patients on the floor on their left side  Loosen clothing around the neck and ensure the airway is patent. If the patient is clenching the teeth, do not force the mouth open with any object as this can cause severe damage - Remove all items from the surrounding that can be hazardous. The patient may be oblivious to what's happening and may not even know what he or she is doing. If the patient is confused and wandering, either gently guide him/her away and block access to outside areas - Reassure the individual and be comforting - Call 911. In most cases, the seizure ends before EMS arrives. However, there are cases when seizures may last over 3 to 5 minutes. Or the individual may have developed breathing difficulties or severe injuries. If a pregnant patient or a person with diabetes develops a seizure, it is prudent to call an ambulance. - Finally, if the patient does not regain full consciousness, then call EMS. Most patients will remain confused for about 45 to 90 minutes after a seizure, so you must use judgment in calling for help. - Avoid restraints but make sure the patient is in a bed with padded side rails - Place the individual in a lateral position with the neck slightly flexed; this will help the saliva drain from the mouth and prevent the tongue from falling backward - Remove all nearby furniture and other hazards from the area - Provide verbal assurance as the individual is regaining consciousness - Provide the patient with privacy if possible - Call for help and start treatment as ordered by the caregiver   fter the Seizure (Postictal Stage)  After a seizure, most patients experience confusion, fatigue, muscle pain and/or a headache. Thus, one should permit the individual to sleep. For the next few days, reassurance is essential. Being calm and helping reorient the person  is also of importance.  Most seizures are painless and end spontaneously. Seizures are not harmful to others but can lead to complications such as stress on the lungs, brain and the heart. Individuals with prior lung problems may develop labored breathing and respiratory distress.    Orders Placed This Encounter  Procedures  . MR BRAIN W WO CONTRAST  . EEG   Meds ordered this encounter  Medications  . levETIRAcetam (KEPPRA) 500 MG tablet    Sig: Take 1 tablet (500 mg total) by mouth every 12 (twelve) hours.    Dispense:  60 tablet    Refill:  3    Cc: Doree Albee, MD,  Ailene Ards, NP  Sarina Ill, MD  [facility] [address] [place], [place] [address]  Phone [phone] Fax [phone]

## 2020-02-16 ENCOUNTER — Ambulatory Visit: Payer: Medicare Other | Admitting: Neurology

## 2020-02-16 ENCOUNTER — Encounter: Payer: Self-pay | Admitting: Neurology

## 2020-02-16 ENCOUNTER — Telehealth: Payer: Self-pay | Admitting: Neurology

## 2020-02-16 ENCOUNTER — Other Ambulatory Visit: Payer: Self-pay

## 2020-02-16 VITALS — BP 143/72 | HR 69 | Temp 98.1°F | Ht 59.0 in | Wt 115.0 lb

## 2020-02-16 DIAGNOSIS — G9340 Encephalopathy, unspecified: Secondary | ICD-10-CM | POA: Diagnosis not present

## 2020-02-16 DIAGNOSIS — R569 Unspecified convulsions: Secondary | ICD-10-CM | POA: Diagnosis not present

## 2020-02-16 HISTORY — DX: Unspecified convulsions: R56.9

## 2020-02-16 MED ORDER — LEVETIRACETAM 500 MG PO TABS: 500.0000 mg | ORAL_TABLET | Freq: Two times a day (BID) | ORAL | 3 refills | Status: DC

## 2020-02-16 NOTE — Patient Instructions (Addendum)
MRI of the brain with contrast  EEG here in the office nad if that is negative we will do an extended EEG likely 72 hours at home Start Keppra for seizure prevention   Seizure, Adult A seizure is a sudden burst of abnormal electrical activity in the brain. Seizures usually last from 30 seconds to 2 minutes. They can cause many different symptoms. Usually, seizures are not harmful unless they last a long time. What are the causes? Common causes of this condition include:  Fever or infection.  Conditions that affect the brain, such as: ? A brain abnormality that you were born with. ? A brain or head injury. ? Bleeding in the brain. ? A tumor. ? Stroke. ? Brain disorders such as autism or cerebral palsy.  Low blood sugar.  Conditions that are passed from parent to child (are inherited).  Problems with substances, such as: ? Having a reaction to a drug or a medicine. ? Suddenly stopping the use of a substance (withdrawal). In some cases, the cause may not be known. A person who has repeated seizures over time without a clear cause has a condition called epilepsy. What increases the risk? You are more likely to get this condition if you have:  A family history of epilepsy.  Had a seizure in the past.  A brain disorder.  A history of head injury, lack of oxygen at birth, or strokes. What are the signs or symptoms? There are many types of seizures. The symptoms vary depending on the type of seizure you have. Examples of symptoms during a seizure include:  Shaking (convulsions).  Stiffness in the body.  Passing out (losing consciousness).  Head nodding.  Staring.  Not responding to sound or touch.  Loss of bladder control and bowel control. Some people have symptoms right before and right after a seizure happens. Symptoms before a seizure may include:  Fear.  Worry (anxiety).  Feeling like you may vomit (nauseous).  Feeling like the room is spinning  (vertigo).  Feeling like you saw or heard something before (dj vu).  Odd tastes or smells.  Changes in how you see. You may see flashing lights or spots. Symptoms after a seizure happens can include:  Confusion.  Sleepiness.  Headache.  Weakness on one side of the body. How is this treated? Most seizures will stop on their own in under 5 minutes. In these cases, no treatment is needed. Seizures that last longer than 5 minutes will usually need treatment. Treatment can include:  Medicines given through an IV tube.  Avoiding things that are known to cause your seizures. These can include medicines that you take for another condition.  Medicines to treat epilepsy.  Surgery to stop the seizures. This may be needed if medicines do not help. Follow these instructions at home: Medicines  Take over-the-counter and prescription medicines only as told by your doctor.  Do not eat or drink anything that may keep your medicine from working, such as alcohol. Activity  Do not do any activities that would be dangerous if you had another seizure, like driving or swimming. Wait until your doctor says it is safe for you to do them.  If you live in the [place] ask your local DMV (department of motor vehicles) when you can drive.  Get plenty of rest. Teaching others Teach friends and family what to do when you have a seizure. They should:  Lay you on the ground.  Protect your head and body.  Loosen  any tight clothing around your neck.  Turn you on your side.  Not hold you down.  Not put anything into your mouth.  Know whether or not you need emergency care.  Stay with you until you are better.  General instructions  Contact your doctor each time you have a seizure.  Avoid anything that gives you seizures.  Keep a seizure diary. Write down: ? What you think caused each seizure. ? What you remember about each seizure.  Keep all follow-up visits as told by your doctor.  This is important. Contact a doctor if:  You have another seizure.  You have seizures more often.  There is any change in what happens during your seizures.  You keep having seizures with treatment.  You have symptoms of being sick or having an infection. Get help right away if:  You have a seizure that: ? Lasts longer than 5 minutes. ? Is different than seizures you had before. ? Makes it harder to breathe. ? Happens after you hurt your head.  You have any of these symptoms after a seizure: ? Not being able to speak. ? Not being able to use a part of your body. ? Confusion. ? A bad headache.  You have two or more seizures in a row.  You do not wake up right after a seizure.  You get hurt during a seizure. These symptoms may be an emergency. Do not wait to see if the symptoms will go away. Get medical help right away. Call your local emergency services (911 in the [place] Do not drive yourself to the hospital. Summary  Seizures usually last from 30 seconds to 2 minutes. Usually, they are not harmful unless they last a long time.  Do not eat or drink anything that may keep your medicine from working, such as alcohol.  Teach friends and family what to do when you have a seizure.  Contact your doctor each time you have a seizure. This information is not intended to replace advice given to you by your health care provider. Make sure you discuss any questions you have with your health care provider. Document Revised: 01/22/2019 Document Reviewed: 01/22/2019 Elsevier Patient Education  [place].   Levetiracetam tablets What is this medicine? LEVETIRACETAM (lee ve tye RA se tam) is an antiepileptic drug. It is used with other medicines to treat certain types of seizures. This medicine may be used for other purposes; ask your health care provider or pharmacist if you have questions. COMMON BRAND NAME(S): Keppra, Roweepra What should I tell my health care provider  before I take this medicine? They need to know if you have any of these conditions:  kidney disease  suicidal thoughts, plans, or attempt; a previous suicide attempt by you or a family member  an unusual or allergic reaction to levetiracetam, other medicines, foods, dyes, or preservatives  pregnant or trying to get pregnant  breast-feeding How should I use this medicine? Take this medicine by mouth with a glass of water. Follow the directions on the prescription label. Swallow the tablets whole. Do not crush or chew this medicine. You may take this medicine with or without food. Take your doses at regular intervals. Do not take your medicine more often than directed. Do not stop taking this medicine or any of your seizure medicines unless instructed by your doctor or health care professional. Stopping your medicine suddenly can increase your seizures or their severity. A special MedGuide will be given to you  by the pharmacist with each prescription and refill. Be sure to read this information carefully each time. Contact your pediatrician or health care professional regarding the use of this medication in children. While this drug may be prescribed for children as young as 59 years of age for selected conditions, precautions do apply. Overdosage: If you think you have taken too much of this medicine contact a poison control center or emergency room at once. NOTE: This medicine is only for you. Do not share this medicine with others. What if I miss a dose? If you miss a dose, take it as soon as you can. If it is almost time for your next dose, take only that dose. Do not take double or extra doses. What may interact with this medicine? This medicine may interact with the following medications:  carbamazepine  colesevelam  probenecid  sevelamer This list may not describe all possible interactions. Give your health care provider a list of all the medicines, herbs, non-prescription drugs, or  dietary supplements you use. Also tell them if you smoke, drink alcohol, or use illegal drugs. Some items may interact with your medicine. What should I watch for while using this medicine? Visit your doctor or health care provider for a regular check on your progress. Wear a medical identification bracelet or chain to say you have epilepsy, and carry a card that lists all your medications. This medicine may cause serious skin reactions. They can happen weeks to months after starting the medicine. Contact your health care provider right away if you notice fevers or flu-like symptoms with a rash. The rash may be red or purple and then turn into blisters or peeling of the skin. Or, you might notice a red rash with swelling of the face, lips or lymph nodes in your neck or under your arms. It is important to take this medicine exactly as instructed by your health care provider. When first starting treatment, your dose may need to be adjusted. It may take weeks or months before your dose is stable. You should contact your doctor or health care provider if your seizures get worse or if you have any new types of seizures. You may get drowsy or dizzy. Do not drive, use machinery, or do anything that needs mental alertness until you know how this medicine affects you. Do not stand or sit up quickly, especially if you are an older patient. This reduces the risk of dizzy or fainting spells. Alcohol may interfere with the effect of this medicine. Avoid alcoholic drinks. The use of this medicine may increase the chance of suicidal thoughts or actions. Pay special attention to how you are responding while on this medicine. Any worsening of mood, or thoughts of suicide or dying should be reported to your health care provider right away. Women who become pregnant while using this medicine may enroll in the [place] Pregnancy Registry by calling [phone]. This registry collects information about  the safety of antiepileptic drug use during pregnancy. What side effects may I notice from receiving this medicine? Side effects that you should report to your doctor or health care professional as soon as possible:  allergic reactions like skin rash, itching or hives, swelling of the face, lips, or tongue  breathing problems  dark urine  general ill feeling or flu-like symptoms  problems with balance, talking, walking  rash, fever, and swollen lymph nodes  redness, blistering, peeling or loosening of the skin, including inside the mouth  unusually  weak or tired  worsening of mood, thoughts or actions of suicide or dying  yellowing of the eyes or skin Side effects that usually do not require medical attention (report to your doctor or health care professional if they continue or are bothersome):  diarrhea  dizzy, drowsy  headache  loss of appetite This list may not describe all possible side effects. Call your doctor for medical advice about side effects. You may report side effects to FDA at 1-800-FDA-1088. Where should I keep my medicine? Keep out of reach of children. Store at room temperature between 15 and 30 degrees C (59 and 86 degrees F). Throw away any unused medicine after the expiration date. NOTE: This sheet is a summary. It may not cover all possible information. If you have questions about this medicine, talk to your doctor, pharmacist, or health care provider.  2020 Elsevier/Gold Standard (2019-02-05 15:23:36)

## 2020-02-16 NOTE — Telephone Encounter (Signed)
[employer] medicare order sent to [facility]. No auth they will reach out to the patient to schedule.  

## 2020-02-22 ENCOUNTER — Other Ambulatory Visit (INDEPENDENT_AMBULATORY_CARE_PROVIDER_SITE_OTHER): Payer: Self-pay

## 2020-02-22 MED ORDER — THYROID 60 MG PO TABS: 60.0000 mg | ORAL_TABLET | Freq: Every day | ORAL | 0 refills | Status: DC

## 2020-03-03 ENCOUNTER — Other Ambulatory Visit (HOSPITAL_COMMUNITY)
Admission: RE | Admit: 2020-03-03 | Discharge: 2020-03-03 | Disposition: A | Discharge status: Home / Self Care | Payer: Medicare Other | Source: Ambulatory Visit | Attending: Gastroenterology | Admitting: Gastroenterology

## 2020-03-03 ENCOUNTER — Other Ambulatory Visit (HOSPITAL_COMMUNITY): Payer: Medicare Other

## 2020-03-03 ENCOUNTER — Other Ambulatory Visit: Payer: Self-pay

## 2020-03-03 DIAGNOSIS — Z20822 Contact with and (suspected) exposure to covid-19: Secondary | ICD-10-CM | POA: Diagnosis not present

## 2020-03-03 DIAGNOSIS — Z01812 Encounter for preprocedural laboratory examination: Secondary | ICD-10-CM | POA: Insufficient documentation

## 2020-03-04 LAB — SARS CORONAVIRUS 2 (TAT 6-24 HRS): SARS Coronavirus 2: NEGATIVE

## 2020-03-06 ENCOUNTER — Other Ambulatory Visit: Payer: Self-pay

## 2020-03-06 ENCOUNTER — Encounter (HOSPITAL_COMMUNITY)
Admission: RE | Disposition: A | Discharge status: Home / Self Care | Payer: Self-pay | Source: Home / Self Care | Attending: Gastroenterology

## 2020-03-06 ENCOUNTER — Encounter (HOSPITAL_COMMUNITY): Payer: Self-pay | Admitting: Gastroenterology

## 2020-03-06 ENCOUNTER — Ambulatory Visit (HOSPITAL_COMMUNITY)
Admission: RE | Admit: 2020-03-06 | Discharge: 2020-03-06 | Disposition: A | Discharge status: Home / Self Care | Payer: Medicare Other | Attending: Gastroenterology | Admitting: Gastroenterology

## 2020-03-06 ENCOUNTER — Telehealth (INDEPENDENT_AMBULATORY_CARE_PROVIDER_SITE_OTHER): Payer: Self-pay

## 2020-03-06 DIAGNOSIS — Z8249 Family history of ischemic heart disease and other diseases of the circulatory system: Secondary | ICD-10-CM | POA: Diagnosis not present

## 2020-03-06 DIAGNOSIS — E559 Vitamin D deficiency, unspecified: Secondary | ICD-10-CM | POA: Diagnosis not present

## 2020-03-06 DIAGNOSIS — K5730 Diverticulosis of large intestine without perforation or abscess without bleeding: Secondary | ICD-10-CM | POA: Diagnosis not present

## 2020-03-06 DIAGNOSIS — D122 Benign neoplasm of ascending colon: Secondary | ICD-10-CM | POA: Insufficient documentation

## 2020-03-06 DIAGNOSIS — E785 Hyperlipidemia, unspecified: Secondary | ICD-10-CM | POA: Diagnosis not present

## 2020-03-06 DIAGNOSIS — Z808 Family history of malignant neoplasm of other organs or systems: Secondary | ICD-10-CM | POA: Insufficient documentation

## 2020-03-06 DIAGNOSIS — D123 Benign neoplasm of transverse colon: Secondary | ICD-10-CM | POA: Insufficient documentation

## 2020-03-06 DIAGNOSIS — M8580 Other specified disorders of bone density and structure, unspecified site: Secondary | ICD-10-CM | POA: Diagnosis not present

## 2020-03-06 DIAGNOSIS — Q438 Other specified congenital malformations of intestine: Secondary | ICD-10-CM | POA: Diagnosis not present

## 2020-03-06 DIAGNOSIS — Z1211 Encounter for screening for malignant neoplasm of colon: Secondary | ICD-10-CM

## 2020-03-06 DIAGNOSIS — K635 Polyp of colon: Secondary | ICD-10-CM

## 2020-03-06 DIAGNOSIS — Z823 Family history of stroke: Secondary | ICD-10-CM | POA: Diagnosis not present

## 2020-03-06 DIAGNOSIS — R569 Unspecified convulsions: Secondary | ICD-10-CM | POA: Insufficient documentation

## 2020-03-06 DIAGNOSIS — E039 Hypothyroidism, unspecified: Secondary | ICD-10-CM | POA: Insufficient documentation

## 2020-03-06 DIAGNOSIS — K644 Residual hemorrhoidal skin tags: Secondary | ICD-10-CM | POA: Insufficient documentation

## 2020-03-06 DIAGNOSIS — Z9049 Acquired absence of other specified parts of digestive tract: Secondary | ICD-10-CM | POA: Insufficient documentation

## 2020-03-06 DIAGNOSIS — Z79899 Other long term (current) drug therapy: Secondary | ICD-10-CM | POA: Diagnosis not present

## 2020-03-06 DIAGNOSIS — Z842 Family history of other diseases of the genitourinary system: Secondary | ICD-10-CM | POA: Insufficient documentation

## 2020-03-06 HISTORY — PX: COLONOSCOPY: SHX5424

## 2020-03-06 HISTORY — PX: POLYPECTOMY: SHX5525

## 2020-03-06 SURGERY — COLONOSCOPY
Anesthesia: Moderate Sedation

## 2020-03-06 MED ORDER — MEPERIDINE HCL 100 MG/ML IJ SOLN
INTRAMUSCULAR | Status: DC | PRN
  Administered 2020-03-06: 25 mg via INTRAVENOUS

## 2020-03-06 MED ORDER — MEPERIDINE HCL 100 MG/ML IJ SOLN
INTRAMUSCULAR | Status: AC
  Filled 2020-03-06: qty 2

## 2020-03-06 MED ORDER — MIDAZOLAM HCL 5 MG/5ML IJ SOLN
INTRAMUSCULAR | Status: AC
  Filled 2020-03-06: qty 10

## 2020-03-06 MED ORDER — SODIUM CHLORIDE 0.9 % IV SOLN: INTRAVENOUS | Status: DC

## 2020-03-06 MED ORDER — MIDAZOLAM HCL 5 MG/5ML IJ SOLN
INTRAMUSCULAR | Status: DC | PRN
  Administered 2020-03-06: 1 mg via INTRAVENOUS
  Administered 2020-03-06: 2 mg via INTRAVENOUS

## 2020-03-06 MED ORDER — STERILE WATER FOR IRRIGATION IR SOLN
Status: DC | PRN
  Administered 2020-03-06: 1.5 mL

## 2020-03-06 NOTE — Telephone Encounter (Signed)
I do not know why she was given Armour Thyroid.  The prescription has always been for NP thyroid.  However, she should tolerate this medication and I can certainly send a new prescription for NP thyroid as she has always tolerated this.  It might be related to her insurance.  Let me know what she wants to do.

## 2020-03-06 NOTE — Op Note (Addendum)
[facility] Patient Name: Kaitlin Jones Procedure Date: 03/06/2020 8:29 AM MRN: [id number] Date of Birth: Apr 11, 1951 Attending MD: Barney Drain MD, MD CSN: [id number] Age: 69 Admit Type: Outpatient Procedure:                Colonoscopy WITH COLD SNARE POLYPECTOMY Indications:              Screening for colorectal malignant neoplasm Providers:                Barney Drain MD, MD, Charlsie Quest. Theda Sers RN, RN,                            Nelma Rothman, Technician Referring MD:             Jeralyn Ruths, NP Medicines:                Meperidine 25 mg IV, Midazolam 3 mg IV Complications:            No immediate complications. Estimated Blood Loss:     Estimated blood loss was minimal. Procedure:                Pre-Anesthesia Assessment:                           - Prior to the procedure, a History and Physical                            was performed, and patient medications and                            allergies were reviewed. The patient's tolerance of                            previous anesthesia was also reviewed. The risks                            and benefits of the procedure and the sedation                            options and risks were discussed with the patient.                            All questions were answered, and informed consent                            was obtained. Prior Anticoagulants: The patient has                            taken no previous anticoagulant or antiplatelet                            agents. ASA Grade Assessment: II - A patient with                            mild systemic disease. After reviewing the risks  and benefits, the patient was deemed in                            satisfactory condition to undergo the procedure.                            After obtaining informed consent, the colonoscope                            was passed under direct vision. Throughout the                            procedure, the patient's  blood pressure, pulse, and                            oxygen saturations were monitored continuously. The                            [id number]-[id number]) scope was introduced through                            the anus and advanced to the the cecum, identified                            by appendiceal orifice and ileocecal valve. The                            colonoscopy was somewhat difficult due to a                            tortuous colon. Successful completion of the                            procedure was aided by straightening and shortening                            the scope to obtain bowel loop reduction and                            COLOWRAP. The patient tolerated the procedure well.                            The quality of the bowel preparation was good. The                            ileocecal valve, appendiceal orifice, and rectum                            were photographed. Scope In: 8:48:26 AM Scope Out: 9:11:22 AM Scope Withdrawal Time: 0 hours 18 minutes 4 seconds  Total Procedure Duration: 0 hours 22 minutes 56 seconds  Findings:      Two sessile polyps were found in the proximal ascending colon. The       polyps were 2  to 5 mm in size. These polyps were removed with a cold       snare. Resection and retrieval were complete. To prevent bleeding after       the polypectomy, two hemostatic clips were successfully placed (MR       conditional). There was no bleeding at the end of the procedure.      Two sessile polyps were found in the transverse colon and hepatic       flexure. The polyps were 3 to 4 mm in size. These polyps were removed       with a hot snare. Resection and retrieval were complete.      Multiple small and large-mouthed diverticula were found in the       recto-sigmoid colon and sigmoid colon.      External hemorrhoids were found.      The recto-sigmoid colon, sigmoid colon and descending colon were       moderately tortuous. Impression:                - Two 2 to 5 mm polyps in the proximal ascending                            colon, removed with a cold snare. Resected and                            retrieved. Clips (MR conditional) were placed.                           - TWO 3 to 4 mm polyps in the transverse colon and                            at the hepatic flexure, removed with a hot snare.                            Resected and retrieved.                           - MILD Diverticulosis in the recto-sigmoid colon                            and in the sigmoid colon.                           - External hemorrhoids.                           - Tortuous LEFT colon. Moderate Sedation:      Moderate (conscious) sedation was administered by the endoscopy nurse       and supervised by the endoscopist. The following parameters were       monitored: oxygen saturation, heart rate, blood pressure, and response       to care. Total physician intraservice time was 31 minutes. Recommendation:           - Patient has a contact number available for                            emergencies. The signs  and symptoms of potential                            delayed complications were discussed with the                            patient. Return to normal activities tomorrow.                            Written discharge instructions were provided to the                            patient.                           - NEEDS KUB PRIOR TO MRI DUE TO CLIPS x2 IN                            PROXIMAL ASCENDING COLON.                           - High fiber diet.                           - Continue present medications.                           - Await pathology results.                           - Repeat colonoscopy date to be determined after                            pending pathology results are reviewed for                            surveillance. Procedure Code(s):        --- Professional ---                           [phone], Colonoscopy, flexible;  with removal of                            tumor(s), polyp(s), or other lesion(s) by snare                            technique                           99153, Moderate sedation; each additional 15                            minutes intraservice time                           G0500, Moderate sedation services provided by the  same physician or other qualified health care                            professional performing a gastrointestinal                            endoscopic service that sedation supports,                            requiring the presence of an independent trained                            observer to assist in the monitoring of the                            patient's level of consciousness and physiological                            status; initial 15 minutes of intra-service time;                            patient age 36 years or older (additional time may                            be reported with [id number], as appropriate) Diagnosis Code(s):        --- Professional ---                           Z12.11, Encounter for screening for malignant                            neoplasm of colon                           K63.5, Polyp of colon                           K64.4, Residual hemorrhoidal skin tags                           K57.30, Diverticulosis of large intestine without                            perforation or abscess without bleeding                           Q43.8, Other specified congenital malformations of                            intestine CPT copyright 2019 American Medical Association. All rights reserved. The codes documented in this report are preliminary and upon coder review may  be revised to meet current compliance requirements. Barney Drain, MD Barney Drain MD, MD 03/06/2020 9:27:14 AM This report has been signed electronically. Number of Addenda: 0

## 2020-03-06 NOTE — Telephone Encounter (Signed)
Called left her a voicemail. Will wait for return call back.

## 2020-03-06 NOTE — Telephone Encounter (Signed)
I do not understand this.  She has always been on NP thyroid and I have not changed the medication.  She is tolerating NP thyroid as far as I know.  Let me know if there is something I am missing.

## 2020-03-06 NOTE — H&P (Signed)
Primary Care Physician:  Ailene Ards, NP Primary Gastroenterologist:  Dr. Oneida Alar  Pre-Procedure History & Physical: HPI:  Kaitlin Jones is a 69 y.o. female here for [name].  Past Medical History:  Diagnosis Date  . HLD (hyperlipidemia) 08/10/2019  . Hyperlipidemia   . Hypothyroidism, adult 08/10/2019  . Osteopenia 08/10/2019  . Prediabetes 08/10/2019  . Seizures ([place]) 02/16/2020  . Vitamin D deficiency disease 08/10/2019    Past Surgical History:  Procedure Laterality Date  . CHOLECYSTECTOMY  10/1986    Prior to Admission medications   Medication Sig Start Date End Date Taking? Authorizing Provider  Calcium Carb-Cholecalciferol (CALCIUM + D3 PO) Take by mouth daily. Calcium 200 mg plus Vitamin D3 25 mcg   Yes [provider]  Cholecalciferol (VITAMIN D3) 50 MCG (2000 UT) TABS Take 2 capsules by mouth daily.    Yes [provider]  levETIRAcetam (KEPPRA) 500 MG tablet Take 1 tablet (500 mg total) by mouth every 12 (twelve) hours. 02/16/20  Yes Melvenia Beam, MD  thyroid (NP THYROID) 60 MG tablet Take 1 tablet (60 mg total) by mouth daily. 02/22/20  Yes Doree Albee, MD    Allergies as of 09/16/2019  . (No Known Allergies)    Family History  Problem Relation Age of Onset  . Atrial fibrillation Mother   . CVA Mother   . Stroke Mother   . Heart attack Father   . Heart disease Father 63       heart attack  . Other Brother        enlarge prostate  . Cancer Paternal Grandmother        head and neck  . Seizures Neg Hx     Social History   Socioeconomic History  . Marital status: Single    Spouse name: Not on file  . Number of children: 0  . Years of education: 62  . Highest education level: Not on file  Occupational History  . Occupation: nursing  . Occupation: computers  Tobacco Use  . Smoking status: Never Smoker  . Smokeless tobacco: Never Used  Substance and Sexual Activity  . Alcohol use: No  . Drug use: No  . Sexual  activity: Never    Comment: Never  Other Topics Concern  . Not on file  Social History Narrative   Never married   Lives with brother [name].   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    Social Determinants of Health   Financial Resource Strain:   . Difficulty of Paying Living Expenses:   Food Insecurity:   . Worried About Charity fundraiser in the Last Year:   . Arboriculturist in the Last Year:   Transportation Needs:   . Film/video editor (Medical):   Marland Kitchen Lack of Transportation (Non-Medical):   Physical Activity:   . Days of Exercise per Week:   . Minutes of Exercise per Session:   Stress:   . Feeling of Stress :   Social Connections:   . Frequency of Communication with Friends and Family:   . Frequency of Social Gatherings with Friends and Family:   . Attends Religious Services:   . Active Member of Clubs or Organizations:   . Attends Archivist Meetings:   Marland Kitchen Marital Status:   Intimate Partner Violence:   . Fear of Current or Ex-Partner:   . Emotionally Abused:   Marland Kitchen Physically Abused:   . Sexually Abused:  Review of Systems: See HPI, otherwise negative ROS   Physical Exam: BP (!) 158/69   Pulse 91   Temp 99.1 F (37.3 C) (Oral)   Resp 10   Ht 4\' 11"  (1.499 m)   LMP 11/18/2004 (Exact Date)   SpO2 100%   BMI 23.23 kg/m  General:   Alert,  pleasant and cooperative in NAD Head:  Normocephalic and atraumatic. Neck:  Supple; Lungs:  Clear throughout to auscultation.    Heart:  Regular rate and rhythm. Abdomen:  Soft, nontender and nondistended. Normal bowel sounds, without guarding, and without rebound.   Neurologic:  Alert and  oriented x4;  grossly normal neurologically.  Impression/Plan:    SCREENING  Plan:  1. TCS TODAY DISCUSSED PROCEDURE, BENEFITS, & RISKS: < 1% chance of medication reaction, bleeding, perforation, ASPIRATION, or rupture of spleen/liver requiring surgery to fix it and missed polyps < 1 cm 10-20% of the time.

## 2020-03-06 NOTE — Telephone Encounter (Signed)
Pt called and said she was given amour thyroid ; not Np thyroid. So she wanted to know the difference. And it it will make her gain weight she rather be on Np thyroid. She feel better with it instead.

## 2020-03-06 NOTE — Discharge Instructions (Signed)
You have small EXTERNAL hemorrhoids and MILD diverticulosis IN YOUR LEFT COLON. YOU HAD FOUR SMALL POLYPS REMOVED.  I PLACED TWO CLIPS TO PREVENT BLEEDING IN 7-10 DAYS.   IF YOU NEED AN MRI, LET THE RADIOLOGY TECH KNOW THAT YOU HAD TWO CLIPS PLACED IN YOUR RIGHT COLON. THEY SHOULD FALL OFF IN 30 DAYS.  DRINK WATER TO KEEP YOUR URINE LIGHT YELLOW.  FOLLOW A HIGH FIBER DIET. AVOID ITEMS THAT CAUSE BLOATING. See info below.   USE PREPARATION H FOUR TIMES  A DAY IF NEEDED TO RELIEVE RECTAL PAIN/PRESSURE/BLEEDING.   YOUR BIOPSY RESULTS WILL BE BACK IN 5 BUSINESS DAYS.  YOUR next colonoscopy WILL BE SCHEDULED BASED ON YOUR FINAL PATHOLOGY REPORT.    Colonoscopy Care After Read the instructions outlined below and refer to this sheet in the next week. These discharge instructions provide you with general information on caring for yourself after you leave the hospital. While your treatment has been planned according to the most current medical practices available, unavoidable complications occasionally occur. If you have any problems or questions after discharge, call DR. Makahla Kiser, [phone].  ACTIVITY  You may resume your regular activity, but move at a slower pace for the next 24 hours.   Take frequent rest periods for the next 24 hours.   Walking will help get rid of the air and reduce the bloated feeling in your belly (abdomen).   No driving for 24 hours (because of the medicine (anesthesia) used during the test).   You may shower.   Do not sign any important legal documents or operate any machinery for 24 hours (because of the anesthesia used during the test).    NUTRITION  Drink plenty of fluids.   You may resume your normal diet as instructed by your doctor.   Begin with a light meal and progress to your normal diet. Heavy or fried foods are harder to digest and may make you feel sick to your stomach (nauseated).   Avoid alcoholic beverages for 24 hours or as instructed.      MEDICATIONS  You may resume your normal medications.   WHAT YOU CAN EXPECT TODAY  Some feelings of bloating in the abdomen.   Passage of more gas than usual.   Spotting of blood in your stool or on the toilet paper  .  IF YOU HAD POLYPS REMOVED DURING THE COLONOSCOPY:  Eat a soft diet IF YOU HAVE NAUSEA, BLOATING, ABDOMINAL PAIN, OR VOMITING.    FINDING OUT THE RESULTS OF YOUR TEST Not all test results are available during your visit. DR. Oneida Alar WILL CALL YOU WITHIN 14 DAYS OF YOUR PROCEDUE WITH YOUR RESULTS. Do not assume everything is normal if you have not heard from DR. Rithika Seel, CALL HER OFFICE AT [phone].  SEEK IMMEDIATE MEDICAL ATTENTION AND CALL THE OFFICE: [phone] IF:  You have more than a spotting of blood in your stool.   Your belly is swollen (abdominal distention).   You are nauseated or vomiting.   You have a temperature over 101F.   You have abdominal pain or discomfort that is severe or gets worse throughout the day.  High-Fiber Diet A high-fiber diet changes your normal diet to include more whole grains, legumes, fruits, and vegetables. Changes in the diet involve replacing refined carbohydrates with unrefined foods. The calorie level of the diet is essentially unchanged. The Dietary Reference Intake (recommended amount) for adult males is 38 grams per day. For adult females, it is 25 grams per day.  Pregnant and lactating women should consume 28 grams of fiber per day. Fiber is the intact part of a plant that is not broken down during digestion. Functional fiber is fiber that has been isolated from the plant to provide a beneficial effect in the body.  PURPOSE  Increase stool bulk.   Ease and regulate bowel movements.   Lower cholesterol.   REDUCE RISK OF COLON CANCER  INDICATIONS THAT YOU NEED MORE FIBER  Constipation and hemorrhoids.   Uncomplicated diverticulosis (intestine condition) and irritable bowel syndrome.   Weight  management.   As a protective measure against hardening of the arteries (atherosclerosis), diabetes, and cancer.   GUIDELINES FOR INCREASING FIBER IN THE DIET  Start adding fiber to the diet slowly. A gradual increase of about 5 more grams (2 servings of most fruits or vegetables) per day is best. Too rapid an increase in fiber may result in constipation, flatulence, and bloating.   Drink enough water and fluids to keep your urine clear or pale yellow. Water, juice, or caffeine-free drinks are recommended. Not drinking enough fluid may cause constipation.   Eat a variety of high-fiber foods rather than one type of fiber.   Try to increase your intake of fiber through using high-fiber foods rather than fiber pills or supplements that contain small amounts of fiber.   The goal is to change the types of food eaten. Do not supplement your present diet with high-fiber foods, but replace foods in your present diet.    Polyps, Colon  A polyp is extra tissue that grows inside your body. Colon polyps grow in the large intestine. The large intestine, also called the colon, is part of your digestive system. It is a long, hollow tube at the end of your digestive tract where your body makes and stores stool. Most polyps are not dangerous. They are benign. This means they are not cancerous. But over time, some types of polyps can turn into cancer. Polyps that are smaller than a pea are usually not harmful. But larger polyps could someday become or may already be cancerous. To be safe, doctors remove all polyps and test them.   PREVENTION There is not one sure way to prevent polyps. You might be able to lower your risk of getting them if you:  Eat more fruits and vegetables and less fatty food.   Do not smoke.   Avoid alcohol.   Exercise every day.   Lose weight if you are overweight.   Eating more calcium and folate can also lower your risk of getting polyps. Some foods that are rich in calcium  are milk, cheese, and broccoli. Some foods that are rich in folate are chickpeas, kidney beans, and spinach.    Diverticulosis Diverticulosis is a common condition that develops when small pouches (diverticula) form in the wall of the colon. The risk of diverticulosis increases with age. It happens more often in people who eat a low-fiber diet. Most individuals with diverticulosis have no symptoms. Those individuals with symptoms usually experience belly (abdominal) pain, constipation, or loose stools (diarrhea).  HOME CARE INSTRUCTIONS  Increase the amount of fiber in your diet as directed by your caregiver or dietician. This may reduce symptoms of diverticulosis.   Drink at least 6 to 8 glasses of water each day to prevent constipation.   Try not to strain when you have a bowel movement.   Avoiding nuts and seeds to prevent complications is NOT NECESSARY.   FOODS HAVING HIGH FIBER  CONTENT INCLUDE:  Fruits. Apple, peach, pear, tangerine, raisins, prunes.   Vegetables. Brussels sprouts, asparagus, broccoli, cabbage, carrot, cauliflower, romaine lettuce, spinach, summer squash, tomato, winter squash, zucchini.   Starchy Vegetables. Baked beans, kidney beans, lima beans, split peas, lentils, potatoes (with skin).    SEEK IMMEDIATE MEDICAL CARE IF:  You develop increasing pain or severe bloating.   You have an oral temperature above 101F.   You develop vomiting or bowel movements that are bloody or black.

## 2020-03-07 LAB — SURGICAL PATHOLOGY

## 2020-03-08 ENCOUNTER — Other Ambulatory Visit (INDEPENDENT_AMBULATORY_CARE_PROVIDER_SITE_OTHER): Payer: Self-pay | Admitting: Internal Medicine

## 2020-03-08 ENCOUNTER — Telehealth: Payer: Self-pay | Admitting: Internal Medicine

## 2020-03-08 ENCOUNTER — Ambulatory Visit: Payer: Medicare Other | Admitting: Neurology

## 2020-03-08 DIAGNOSIS — R569 Unspecified convulsions: Secondary | ICD-10-CM

## 2020-03-08 DIAGNOSIS — G9340 Encephalopathy, unspecified: Secondary | ICD-10-CM

## 2020-03-08 MED ORDER — THYROID 60 MG PO TABS: 60.0000 mg | ORAL_TABLET | Freq: Every day | ORAL | 0 refills | Status: DC

## 2020-03-08 NOTE — Telephone Encounter (Signed)
fyi

## 2020-03-08 NOTE — Telephone Encounter (Signed)
Called sleep lab and notified Kaitlin Jones of what provider stated that pt is ok to have eeg and mri , stated he would notify the pt

## 2020-03-08 NOTE — Telephone Encounter (Signed)
PLEASE CALL PT's sleep lab, [phone]. I PERSONALLY SPOKE TO MRI(DR. MAXWELL/MIKE). DISCUSSED CLIPS THAT WERE PLACED APR 19. SHE SHOULD HAVE NO PROBLEM HAVING THE EEG OR MRI IN [place] OR [place].

## 2020-03-08 NOTE — Telephone Encounter (Cosign Needed)
Pt would like to go back on NP thyroid. [facility] said [facility] is messing up medications. So send np thyroid to [facility]  on [address]

## 2020-03-08 NOTE — Telephone Encounter (Signed)
I believe she is a RMR patient although SF did procedure on 03/06/2020 since RMR was on PAL. Milana Na from [facility] called asking for the nurse. He had some questions prior to her having EEG and also had questions about her upcoming MRI. He said he would call be in 30 minutes. [phone]

## 2020-03-08 NOTE — Telephone Encounter (Signed)
Routing to [facility]. 

## 2020-03-09 NOTE — Telephone Encounter (Signed)
I believe pt had EEG yesterday.

## 2020-03-09 NOTE — Telephone Encounter (Signed)
Noted, Patient MRI is scheduled at [place] for 03/16/20.

## 2020-03-16 ENCOUNTER — Ambulatory Visit
Admission: RE | Admit: 2020-03-16 | Discharge: 2020-03-16 | Disposition: A | Discharge status: Home / Self Care | Payer: Medicare Other | Source: Ambulatory Visit | Attending: Neurology | Admitting: Neurology

## 2020-03-16 ENCOUNTER — Other Ambulatory Visit: Payer: Self-pay

## 2020-03-16 DIAGNOSIS — R569 Unspecified convulsions: Secondary | ICD-10-CM

## 2020-03-16 DIAGNOSIS — G9340 Encephalopathy, unspecified: Secondary | ICD-10-CM

## 2020-03-16 MED ORDER — GADOBENATE DIMEGLUMINE 529 MG/ML IV SOLN
10.0000 mL | Freq: Once | INTRAVENOUS | Status: AC | PRN
  Administered 2020-03-16: 10:00:00 10 mL via INTRAVENOUS

## 2020-03-17 NOTE — Procedures (Signed)
   HISTORY: 69 year old female episode of body shaking, loss consciousness, postevent confusion  TECHNIQUE:  This is a routine 16 channel EEG recording with one channel devoted to a limited EKG recording.  It was performed during wakefulness, drowsiness and asleep.  Hyperventilation and photic stimulation were performed as activating procedures.  There are minimum muscle and movement artifact noted.  Upon maximum arousal, posterior dominant waking rhythm consistent of rhythmic alpha range activity, with frequency of 9 hz. Activities are symmetric over the bilateral posterior derivations and attenuated with eye opening.  Hyperventilation produced mild/moderate buildup with higher amplitude and the slower activities noted.  Photic stimulation did not alter the tracing.  During EEG recording, patient developed drowsiness and no deeper stage of sleep was achieved  During EEG recording, there was no epileptiform discharge noted.  EKG demonstrate sinus rhythm, with heart rate of 64 bpm  CONCLUSION: This is a  normal awake EEG.  There is no electrodiagnostic evidence of epileptiform discharge.  Marcial Pacas, M.D. Ph.D.  [facility] [address], [place] [address] Phone: [phone] Fax:      [phone]

## 2020-03-20 ENCOUNTER — Telehealth: Payer: Self-pay | Admitting: *Deleted

## 2020-03-20 NOTE — Telephone Encounter (Signed)
Spoke with Kaitlin Jones and discussed MRI results (normal for age) and also EEG results (normal). I let Kaitlin Jones know Dr. Jaynee Eagles recommends an at home 72 hour EEG. Kaitlin Jones is amenable to this and will be on the lookout for a call from [facility] within the next couple of weeks. Kaitlin Jones's questions were answered. She was very appreciative for the call.

## 2020-03-20 NOTE — Telephone Encounter (Signed)
[facility] order form for 72 hour EEG completed, pending MD signature. Also included office note, med/allergy list, insurance information, and EEG results.

## 2020-03-20 NOTE — Telephone Encounter (Signed)
Called only # available x 2, immediately rang busy both times.

## 2020-03-21 NOTE — Telephone Encounter (Signed)
Referral signed by Dr. Jaynee Eagles. Faxed to [facility]. Received a receipt of confirmation.

## 2020-03-22 NOTE — Telephone Encounter (Signed)
Received notification from [facility] that referral has been received and is being processed. They will update our office again once the patient has been successfully scheduled. For any questions call [phone].

## 2020-03-24 ENCOUNTER — Other Ambulatory Visit (HOSPITAL_COMMUNITY): Payer: Self-pay | Admitting: Internal Medicine

## 2020-03-24 DIAGNOSIS — Z1231 Encounter for screening mammogram for malignant neoplasm of breast: Secondary | ICD-10-CM

## 2020-04-12 ENCOUNTER — Encounter: Payer: Self-pay | Admitting: Neurology

## 2020-05-08 ENCOUNTER — Emergency Department (HOSPITAL_COMMUNITY)
Admission: EM | Admit: 2020-05-08 | Discharge: 2020-05-08 | Disposition: A | Discharge status: Home / Self Care | Payer: Medicare Other | Attending: Emergency Medicine | Admitting: Emergency Medicine

## 2020-05-08 ENCOUNTER — Other Ambulatory Visit: Payer: Self-pay

## 2020-05-08 ENCOUNTER — Telehealth: Payer: Self-pay | Admitting: Neurology

## 2020-05-08 ENCOUNTER — Ambulatory Visit (HOSPITAL_COMMUNITY)
Admission: RE | Admit: 2020-05-08 | Discharge: 2020-05-08 | Disposition: A | Discharge status: Home / Self Care | Payer: Medicare Other | Source: Ambulatory Visit | Attending: Internal Medicine | Admitting: Internal Medicine

## 2020-05-08 ENCOUNTER — Encounter (HOSPITAL_COMMUNITY): Payer: Self-pay | Admitting: *Deleted

## 2020-05-08 DIAGNOSIS — G40909 Epilepsy, unspecified, not intractable, without status epilepticus: Secondary | ICD-10-CM | POA: Insufficient documentation

## 2020-05-08 DIAGNOSIS — Z79899 Other long term (current) drug therapy: Secondary | ICD-10-CM | POA: Diagnosis not present

## 2020-05-08 DIAGNOSIS — E039 Hypothyroidism, unspecified: Secondary | ICD-10-CM | POA: Insufficient documentation

## 2020-05-08 DIAGNOSIS — Z1231 Encounter for screening mammogram for malignant neoplasm of breast: Secondary | ICD-10-CM | POA: Diagnosis not present

## 2020-05-08 DIAGNOSIS — R569 Unspecified convulsions: Secondary | ICD-10-CM | POA: Diagnosis present

## 2020-05-08 LAB — COMPREHENSIVE METABOLIC PANEL
ALT: 24 U/L (ref 0–44)
AST: 31 U/L (ref 15–41)
Albumin: 3.5 g/dL (ref 3.5–5.0)
Alkaline Phosphatase: 56 U/L (ref 38–126)
Anion gap: 11 (ref 5–15)
BUN: 26 mg/dL — ABNORMAL HIGH (ref 8–23)
CO2: 27 mmol/L (ref 22–32)
Calcium: 9.2 mg/dL (ref 8.9–10.3)
Chloride: 103 mmol/L (ref 98–111)
Creatinine, Ser: 0.88 mg/dL (ref 0.44–1.00)
GFR calc Af Amer: >60 mL/min (ref >60)
GFR calc non Af Amer: >60 mL/min (ref >60)
Glucose, Bld: 101 mg/dL — ABNORMAL HIGH (ref 70–99)
Potassium: 4.1 mmol/L (ref 3.5–5.1)
Sodium: 141 mmol/L (ref 135–145)
Total Bilirubin: 0.2 mg/dL — ABNORMAL LOW (ref 0.3–1.2)
Total Protein: 6.9 g/dL (ref 6.5–8.1)

## 2020-05-08 LAB — CBC WITH DIFFERENTIAL/PLATELET
Abs Immature Granulocytes: 0.01 10*3/uL (ref 0.00–0.07)
Basophils Absolute: 0.1 10*3/uL (ref 0.0–0.1)
Basophils Relative: 1 %
Eosinophils Absolute: 0.3 10*3/uL (ref 0.0–0.5)
Eosinophils Relative: 6 %
HCT: 44.5 % (ref 36.0–46.0)
Hemoglobin: 14.1 g/dL (ref 12.0–15.0)
Immature Granulocytes: 0 %
Lymphocytes Relative: 37 %
Lymphs Abs: 1.9 10*3/uL (ref 0.7–4.0)
MCH: 29.6 pg (ref 26.0–34.0)
MCHC: 31.7 g/dL (ref 30.0–36.0)
MCV: 93.3 fL (ref 80.0–100.0)
Monocytes Absolute: 0.5 10*3/uL (ref 0.1–1.0)
Monocytes Relative: 9 %
Neutro Abs: 2.4 10*3/uL (ref 1.7–7.7)
Neutrophils Relative %: 47 %
Platelets: 246 10*3/uL (ref 150–400)
RBC: 4.77 MIL/uL (ref 3.87–5.11)
RDW: 12.5 % (ref 11.5–15.5)
WBC: 5.1 10*3/uL (ref 4.0–10.5)
nRBC: 0 % (ref 0.0–0.2)

## 2020-05-08 NOTE — Telephone Encounter (Signed)
Pt's brother Avy Barlett on [place] called wanting to speak to provider about the seizure the pt had this morning between 4:30am and 5am. Please advise.

## 2020-05-08 NOTE — ED Provider Notes (Signed)
[facility] Provider Note   CSN: [id number] Arrival date & time: 05/08/20  9937   Time seen 5:56 AM  History Chief Complaint  Patient presents with  . Seizures    Kaitlin Jones is a 69 y.o. female.  HPI   Per patient and her brother she had 1 seizure which was around March 2.  She is supposed to be taking Keppra twice a day.  I am unable to tell if she is taking it or not.  Her brother states he could not find her bottle this morning.  Patient's brother states he got up and went to the bathroom and when he went back to his bedroom he heard gurgling.  When he went into her bedroom she was still in bed with her eyes closed and seemed to be having some trouble breathing.  He states she was mildly stiff and she would not talk to them.  He did not describe jerking but states maybe she was shaking.  She was in bed when he found her and she stayed in bed, there was no fall.  She had some urinary incontinence.  I noted a red area on her right forehead but he states she had some electrodes on her head recently and that is what left a mark.  Patient told me she does not hurt anywhere.  However afterwards when I talked to her she would not answer whether she is taking her medicine or not.  I could not tell if she was post ictal because she seemed more interactive a few minutes before that.  Her brother states as to where she was the first time she had a seizure.  PCP Doree Albee, MD   Past Medical History:  Diagnosis Date  . HLD (hyperlipidemia) 08/10/2019  . Hyperlipidemia   . Hypothyroidism, adult 08/10/2019  . Osteopenia 08/10/2019  . Prediabetes 08/10/2019  . Seizures ([place]) 02/16/2020  . Vitamin D deficiency disease 08/10/2019    Patient Active Problem List   Diagnosis Date Noted  . Seizures ([place]) 02/16/2020  . Hypothyroidism, adult 08/10/2019  . Vitamin D deficiency disease 08/10/2019  . Prediabetes 08/10/2019  . HLD (hyperlipidemia) 08/10/2019  . Osteopenia  08/10/2019  . Family history of premature coronary heart disease 08/26/2016    Past Surgical History:  Procedure Laterality Date  . CHOLECYSTECTOMY  10/1986  . COLONOSCOPY N/A 03/06/2020   Procedure: COLONOSCOPY;  Surgeon: [name], MD;  [facility];  Service: Endoscopy;  Laterality: N/A;  8:30  . POLYPECTOMY  03/06/2020   Procedure: POLYPECTOMY;  Surgeon: [name], MD;  [facility];  Service: Endoscopy;;     OB History   No obstetric history on file.     Family History  Problem Relation Age of Onset  . Atrial fibrillation Mother   . CVA Mother   . Stroke Mother   . Heart attack Father   . Heart disease Father 53       heart attack  . Other Brother        enlarge prostate  . Cancer Paternal Grandmother        head and neck  . Seizures Neg Hx     Social History   Tobacco Use  . Smoking status: Never Smoker  . Smokeless tobacco: Never Used  Vaping Use  . Vaping Use: Never used  Substance Use Topics  . Alcohol use: No  . Drug use: No  lives with brother  Home  Medications Prior to Admission medications   Medication Sig Start Date End Date Taking? Authorizing Provider  Calcium Carb-Cholecalciferol (CALCIUM + D3 PO) Take by mouth daily. Calcium 200 mg plus Vitamin D3 25 mcg    [provider]  Cholecalciferol (VITAMIN D3) 50 MCG (2000 UT) TABS Take 2 capsules by mouth daily.     [provider]  levETIRAcetam (KEPPRA) 500 MG tablet Take 1 tablet (500 mg total) by mouth every 12 (twelve) hours. 02/16/20   Melvenia Beam, MD  thyroid (NP THYROID) 60 MG tablet Take 1 tablet (60 mg total) by mouth daily. 03/08/20   Doree Albee, MD    Allergies    Patient has no known allergies.  Review of Systems   Review of Systems  All other systems reviewed and are negative.   Physical Exam Updated Vital Signs BP (!) 142/59   Pulse 84   Temp 98.6 F (37 C) (Oral)   Resp 20   Ht 4\' 11"  (1.499 m)   Wt 49.4 kg    LMP 11/18/2004 (Exact Date)   SpO2 100%   BMI 22.02 kg/m   Physical Exam Vitals and nursing note reviewed.  Constitutional:      Appearance: Normal appearance. She is normal weight.  HENT:     Head: Normocephalic and atraumatic.     Comments: Patient has a round area that is reddened on her right forehead.  It is nontender to palpation.    Right Ear: External ear normal.     Left Ear: External ear normal.     Nose: Nose normal.     Mouth/Throat:     Mouth: Mucous membranes are moist.     Comments: Patient appears to have some dried blood on the right side of her face near her mouth however I do not see any obvious lesions to her tongue or her lips. Eyes:     Extraocular Movements: Extraocular movements intact.     Conjunctiva/sclera: Conjunctivae normal.     Pupils: Pupils are equal, round, and reactive to light.  Cardiovascular:     Rate and Rhythm: Normal rate and regular rhythm.  Pulmonary:     Effort: Pulmonary effort is normal. No respiratory distress.     Breath sounds: Normal breath sounds.  Musculoskeletal:        General: Normal range of motion.     Cervical back: Normal range of motion.  Skin:    General: Skin is warm and dry.  Neurological:     General: No focal deficit present.     Mental Status: She is alert and oriented to person, place, and time.     Cranial Nerves: No cranial nerve deficit.  Psychiatric:        Mood and Affect: Affect is flat.        Speech: Speech is delayed.        Behavior: Behavior is slowed.     ED Results / Procedures / Treatments   Labs (all labs ordered are listed, but only abnormal results are displayed) Results for orders placed or performed during the hospital encounter of 05/08/20  Comprehensive metabolic panel  Result Value Ref Range   Sodium 141 135 - 145 mmol/L   Potassium 4.1 3.5 - 5.1 mmol/L   Chloride 103 98 - 111 mmol/L   CO2 27 22 - 32 mmol/L   Glucose, Bld 101 (H) 70 - 99 mg/dL   BUN 26 (H) 8 - 23 mg/dL    Creatinine,  Ser 0.88 0.44 - 1.00 mg/dL   Calcium 9.2 8.9 - 10.3 mg/dL   Total Protein 6.9 6.5 - 8.1 g/dL   Albumin 3.5 3.5 - 5.0 g/dL   AST 31 15 - 41 U/L   ALT 24 0 - 44 U/L   Alkaline Phosphatase 56 38 - 126 U/L   Total Bilirubin 0.2 (L) 0.3 - 1.2 mg/dL   GFR calc non Af Amer >60 >60 mL/min   GFR calc Af Amer >60 >60 mL/min   Anion gap 11 5 - 15  CBC with Differential  Result Value Ref Range   WBC 5.1 4.0 - 10.5 K/uL   RBC 4.77 3.87 - 5.11 MIL/uL   Hemoglobin 14.1 12.0 - 15.0 g/dL   HCT 44.5 36 - 46 %   MCV 93.3 80.0 - 100.0 fL   MCH 29.6 26.0 - 34.0 pg   MCHC 31.7 30.0 - 36.0 g/dL   RDW 12.5 11.5 - 15.5 %   Platelets 246 150 - 400 K/uL   nRBC 0.0 0.0 - 0.2 %   Neutrophils Relative % 47 %   Neutro Abs 2.4 1.7 - 7.7 K/uL   Lymphocytes Relative 37 %   Lymphs Abs 1.9 0.7 - 4.0 K/uL   Monocytes Relative 9 %   Monocytes Absolute 0.5 0 - 1 K/uL   Eosinophils Relative 6 %   Eosinophils Absolute 0.3 0 - 0 K/uL   Basophils Relative 1 %   Basophils Absolute 0.1 0 - 0 K/uL   Immature Granulocytes 0 %   Abs Immature Granulocytes 0.01 0.00 - 0.07 K/uL   Laboratory interpretation all normal except mild elevation of BUN.      EKG EKG Interpretation  Date/Time:  Monday May 08 2020 05:33:12 EDT Ventricular Rate:  97 PR Interval:    QRS Duration: 89 QT Interval:  358 QTC Calculation: 455 R Axis:   -20 Text Interpretation: Sinus rhythm Left ventricular hypertrophy Anterior Q waves, possibly due to LVH Baseline wander in lead(s) V1 Since last tracing 19 Jan 2020 lead V2 has different axis Confirmed by Rolland Porter [phone]) on 05/08/2020 6:39:55 AM   Radiology No results found.  Procedures Procedures (including critical care time)  March 16, 2020 MRI brain IMPRESSION: This MRI of the brain with and without contrast shows the following: 1.   Few punctate T2/FLAIR hyperintense foci in the subcortical white matter, consistent with very minimal chronic microvascular ischemic  change, typical for age. 2.   There are no acute findings and there is a normal enhancement pattern.   EEG March 08, 2020 CONCLUSION: This is a  normal awake EEG.  There is no electrodiagnostic evidence of epileptiform discharge.  Marcial Pacas, M.D. Ph.D.    Medications Ordered in ED Medications - No data to display  ED Course  I have reviewed the triage vital signs and the nursing notes.  Pertinent labs & imaging results that were available during my care of the patient were reviewed by me and considered in my medical decision making (see chart for details).    MDM Rules/Calculators/A&P                         Levetiracetam level was sent to the lab, it will not come back today but it may be useful for her doctor who is managing her seizure activity.  She is currently on a low dose of 500 mg twice a day.  I think I will give her  additional 500 in the ED and have her increase her dose to 1000 mg since she has had her second seizure.  When I review patient's recent MRI and EEG it is not clear if she actually has a seizure disorder.  Patient may benefit from getting a Holter monitor done to watch her for an arrhythmia.  7:20 AM patient is much more alert and interactive.  Her brother states she is back to her baseline.  They now states she only took the [place] for about a month and it "knocked her out" so much that she no longer takes it.  I am going to have them call Dr. Chong Sicilian office, her neurologist to see if she needs to be on a different seizure medication.  Final Clinical Impression(s) / ED Diagnoses Final diagnoses:  Seizure disorder [facility])    Rx / DC Orders ED Discharge Orders    None     Plan discharge  Rolland Porter, MD, Barbette Or, MD 05/08/20 0730

## 2020-05-08 NOTE — Discharge Instructions (Addendum)
Please call Dr. Cathren Laine office to let her know you are not taking any seizure medicine.  Please let her know that you had another event today and see if she wants to try a different seizure medicine or if she feels like based on her recent EEG and MRI that she does not feel you need to be on seizure medication.  Also please call the cardiology office to be  evaluated for possible irregular heartbeat that could be making you have syncopal or passing out spells.

## 2020-05-08 NOTE — ED Triage Notes (Signed)
Pt arrived by ems after pt's brother called them out for seizure type activity. Ems reports pt was combative with them and confused,  cbg 99, pt able to answer questions upon pt arrival to er, has abrasion noted to forehead area, dried blood noted to mouth area. Ems reports that pt was in bed when they arrived to her.

## 2020-05-08 NOTE — Telephone Encounter (Signed)
Called and the patient answered. She states that she only took the [place] for about a month because she felt so drowsy while taking the medication. She also stated with the MRI and EEG being normal she didn't understand why it is necessary to continue. Pt was on Keppra 500 mg BID. She went to the ER this morning with the seizure event that occurred today and they are wanting [place] to have her follow up to discuss an alternative medication that may cause less drowsiness. Advised Dr Jaynee Eagles was out of the office. I was able to get her in with Amy tomorrow 6/22 at 8:30 with check in of 8. Pt verbalized understanding.

## 2020-05-09 ENCOUNTER — Encounter: Payer: Self-pay | Admitting: Family Medicine

## 2020-05-09 ENCOUNTER — Ambulatory Visit: Payer: Medicare Other | Admitting: Family Medicine

## 2020-05-09 VITALS — BP 135/81 | HR 65 | Wt 117.0 lb

## 2020-05-09 DIAGNOSIS — R569 Unspecified convulsions: Secondary | ICD-10-CM

## 2020-05-09 MED ORDER — LEVETIRACETAM 250 MG PO TABS: 250.0000 mg | ORAL_TABLET | Freq: Two times a day (BID) | ORAL | 3 refills | Status: DC

## 2020-05-09 NOTE — Progress Notes (Addendum)
PATIENT: Kaitlin Jones DOB: Jun 23, 1951  REASON FOR VISIT: follow up HISTORY FROM: patient  Chief Complaint  Patient presents with  . Follow-up    Rm 2 here for a seizure f/u. Pt had a seizure yesterday and she has stopped the medication.     HISTORY OF PRESENT ILLNESS: Today 05/11/20 Kaitlin Jones is a 69 y.o. female here today for follow up for seizures. She was seen as a new patient in March, 2021 after her first witnessed seizure event. She was started on levetiracetam 500mg  BID. MRI and EEG were normal. Patient reports that she discontinued levetiracetam after normal results. She was seen yesterday in the ER for second seizure like event. Patient found by brother who reports she was gurgling, shaking and seemed to be having trouble breathing. She was not able to communicate and was incontinent of urine. ER eval unremarkable. Patient was not forthcoming about discontinuation of levetiracetam but later called our office to report she had stopped AED due to feeling groggy. She reports having 72hr EEG a couple of days ago. Results not available at this time. She does wish to resume AED.   HISTORY: (copied from Dr Cathren Laine note on 02/16/2020)  HPI:  Kaitlin Jones is a 69 y.o. female here as requested by Dr. Anastasio Champion for seizure.  Past medical history hypothyroidism, hyperlipidemia, prediabetes.  I reviewed Dr. Lanice Shirts notes, patient recently had an episode of went to the emergency room approximately 3 weeks prior to her last appointment (last seen February 08, 2020) she had loss of consciousness, shaking of limbs and confusion postictally.  She was told not to drive for 6 months.  No further episodes.  CT of the head did not show any major abnormalities.  I also reviewed emergency room notes from January 18, 2020, patient arrived by EMS after her brother who lives with her heard her gurgling, she was less responsive than usual, she had blood in her shirt and a small abrasion on the right side of her  tongue, patient did not know why she was in the emergency room and could not remember anything that happened, she was a poor historian however she did attempt to answer some questions.  Brother described her seizure as tonic-clonic activity for about 1 minute then sleepiness afterwards, he had never seen her have a seizure previously, she was recently started on thyroid medication for hypothyroidism.  I reviewed labs which showed unremarkable CBC, CMP, ethanol was negative, urine did not show infection, culture showed no growth, urine rapid drug screen was negative, she was discharged home when stable.  Patient was in her usual state of health, she had been doing house work, no illness, not sick, no fever, no changes in medications, nothing unusual had happened. She laid down to rest and she doesn't remember anything, her brother who is here provides information, he heard some labored breathing and he saw her eyes were rolled back in her head, she bit her tongue, she was unconscious and had turned white, he called 911 and EMS took her to the hospital while she was fighting them and she doesn't remember.She remembers the ED. She has never had a seizure in the past. No family history of seizures. No episodes since then. No other focal neurologic deficits, associated symptoms, inciting events or modifiable factors.  Reviewed notes, labs and imaging from outside physicians, which showed:  T4 Free normal  Personally reviewed CT head 01/18/2020 and agree with the following: IMPRESSION: 1. No acute intracranial findings.  REVIEW OF SYSTEMS: Out of a complete 14 system review of symptoms, the patient complains only of the following symptoms, seizure and all other reviewed systems are negative.  ALLERGIES: No Known Allergies  HOME MEDICATIONS: Outpatient Medications Prior to Visit  Medication Sig Dispense Refill  . Calcium Carb-Cholecalciferol (CALCIUM + D3 PO) Take by mouth daily. Calcium 200 mg plus  Vitamin D3 25 mcg    . Cholecalciferol (VITAMIN D3) 50 MCG (2000 UT) TABS Take 2 capsules by mouth daily.     Marland Kitchen thyroid (NP THYROID) 60 MG tablet Take 1 tablet (60 mg total) by mouth daily. 90 tablet 0  . levETIRAcetam (KEPPRA) 500 MG tablet Take 1 tablet (500 mg total) by mouth every 12 (twelve) hours. (Patient not taking: Reported on 05/09/2020) 60 tablet 3   No facility-administered medications prior to visit.    PAST MEDICAL HISTORY: Past Medical History:  Diagnosis Date  . HLD (hyperlipidemia) 08/10/2019  . Hyperlipidemia   . Hypothyroidism, adult 08/10/2019  . Osteopenia 08/10/2019  . Prediabetes 08/10/2019  . Seizures ([place]) 02/16/2020  . Vitamin D deficiency disease 08/10/2019    PAST SURGICAL HISTORY: Past Surgical History:  Procedure Laterality Date  . CHOLECYSTECTOMY  10/1986  . COLONOSCOPY N/A 03/06/2020   Procedure: COLONOSCOPY;  Surgeon: [name], MD;  [facility];  Service: Endoscopy;  Laterality: N/A;  8:30  . POLYPECTOMY  03/06/2020   Procedure: POLYPECTOMY;  Surgeon: [name], MD;  [facility];  Service: Endoscopy;;    FAMILY HISTORY: Family History  Problem Relation Age of Onset  . Atrial fibrillation Mother   . CVA Mother   . Stroke Mother   . Heart attack Father   . Heart disease Father 82       heart attack  . Other Brother        enlarge prostate  . Cancer Paternal Grandmother        head and neck  . Seizures Neg Hx     SOCIAL HISTORY: Social History   Socioeconomic History  . Marital status: Single    Spouse name: Not on file  . Number of children: 0  . Years of education: 42  . Highest education level: Not on file  Occupational History  . Occupation: nursing  . Occupation: computers  Tobacco Use  . Smoking status: Never Smoker  . Smokeless tobacco: Never Used  Vaping Use  . Vaping Use: Never used  Substance and Sexual Activity  . Alcohol use: No  . Drug use: No  . Sexual activity: Never    Comment:  Never  Other Topics Concern  . Not on file  Social History Narrative   Never married   Lives with brother [name].   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    Social Determinants of Health   Financial Resource Strain:   . Difficulty of Paying Living Expenses:   Food Insecurity:   . Worried About Charity fundraiser in the Last Year:   . Arboriculturist in the Last Year:   Transportation Needs:   . Film/video editor (Medical):   Marland Kitchen Lack of Transportation (Non-Medical):   Physical Activity:   . Days of Exercise per Week:   . Minutes of Exercise per Session:   Stress:   . Feeling of Stress :   Social Connections:   . Frequency of Communication with Friends and Family:   . Frequency of Social Gatherings with Friends and  Family:   . Attends Religious Services:   . Active Member of Clubs or Organizations:   . Attends Archivist Meetings:   Marland Kitchen Marital Status:   Intimate Partner Violence:   . Fear of Current or Ex-Partner:   . Emotionally Abused:   Marland Kitchen Physically Abused:   . Sexually Abused:       PHYSICAL EXAM  Vitals:   05/09/20 0758  BP: 135/81  Pulse: 65  Weight: 117 lb (53.1 kg)   Body mass index is 23.63 kg/m.  Generalized: Well developed, in no acute distress  Cardiology: normal rate and rhythm, no murmur noted Respiratory: clear to auscultation bilaterally  Neurological examination  Mentation: Alert oriented to time, place, history taking. Follows all commands speech and language fluent Cranial nerve II-XII: Pupils were equal round reactive to light. Extraocular movements were full, visual field were full on confrontational test. Facial sensation and strength were normal. Uvula tongue midline. Head turning and shoulder shrug  were normal and symmetric. Motor: The motor testing reveals 5 over 5 strength of all 4 extremities. Good symmetric motor tone is noted throughout.  Sensory: Sensory testing is intact to soft touch on all 4 extremities. No evidence  of extinction is noted.  Coordination: Cerebellar testing reveals good finger-nose-finger and heel-to-shin bilaterally.  Gait and station: Gait is normal.   DIAGNOSTIC DATA (LABS, IMAGING, TESTING) - I reviewed patient records, labs, notes, testing and imaging myself where available.  No flowsheet data found.   Lab Results  Component Value Date   WBC 5.1 05/08/2020   HGB 14.1 05/08/2020   HCT 44.5 05/08/2020   MCV 93.3 05/08/2020   PLT 246 05/08/2020      Component Value Date/Time   NA 141 05/08/2020 0637   K 4.1 05/08/2020 0637   CL 103 05/08/2020 0637   CO2 27 05/08/2020 0637   GLUCOSE 101 (H) 05/08/2020 0637   BUN 26 (H) 05/08/2020 0637   CREATININE 0.88 05/08/2020 0637   CREATININE 0.75 08/10/2019 1108   CALCIUM 9.2 05/08/2020 0637   PROT 6.9 05/08/2020 0637   ALBUMIN 3.5 05/08/2020 0637   AST 31 05/08/2020 0637   ALT 24 05/08/2020 0637   ALKPHOS 56 05/08/2020 0637   BILITOT 0.2 (L) 05/08/2020 0637   GFRNONAA >60 05/08/2020 0637   GFRNONAA 82 08/10/2019 1108   GFRAA >60 05/08/2020 0637   GFRAA 96 08/10/2019 1108   Lab Results  Component Value Date   CHOL 171 08/10/2019   HDL 55 08/10/2019   LDLCALC 96 08/10/2019   TRIG 108 08/10/2019   CHOLHDL 3.1 08/10/2019   Lab Results  Component Value Date   HGBA1C 5.0 08/10/2019   No results found for: [id number] Lab Results  Component Value Date   TSH <0.01 (L) 11/09/2019       ASSESSMENT AND PLAN 69 y.o. year old female  has a past medical history of HLD (hyperlipidemia) (08/10/2019), Hyperlipidemia, Hypothyroidism, adult (08/10/2019), Osteopenia (08/10/2019), Prediabetes (08/10/2019), Seizures ([place]) (02/16/2020), and Vitamin D deficiency disease (08/10/2019). here with     ICD-10-CM   1. Seizures ([place])  R56.9     Adella Nissen is doing ok today. Second witnessed seizure like event occurred yesterday. No obvious triggers or provoked etiology. We are waiting on 72 hour EEG results. I have recommended restarting AED. We  have discussed several options and she is most comfortable restarting levetiracetam. I will restart dose at 250mg  twice daily. Will anticipate titrating dose if she can tolerate it. She was advised  not to drive. She will ensure adequate hydration. We have discussed importance of compliance with medication regimen. Healthy diet and regular exercise encouraged. She will follow up with me in 3 months, sooner if needed. She verbalizes understanding and agreement with this plan.    No orders of the defined types were placed in this encounter.    Meds ordered this encounter  Medications  . levETIRAcetam (KEPPRA) 250 MG tablet    Sig: Take 1 tablet (250 mg total) by mouth 2 (two) times daily.    Dispense:  180 tablet    Refill:  3    Order Specific Question:   Supervising Provider    Answer:   Melvenia Beam [id number]      I spent 15 minutes with the patient. 50% of this time was spent counseling and educating patient on plan of care and medications.    Debbora Presto, FNP-C 05/11/2020, 8:33 AM [facility] [address], [place], [place] [address] [phone]  Made any corrections needed, and agree with history, physical, neuro exam,assessment and plan as stated.     Sarina Ill, MD [facility]

## 2020-05-09 NOTE — Patient Instructions (Addendum)
We will restart levetiracetam. Start 250mg  twice daily. Please let me know if you continue to have sleepiness. We can consider another seizure medication if needed.   According to [place] law, you can not drive unless you are seizure / syncope free for at least 6 months and under physician's care.  Please maintain precautions. Do not participate in activities where a loss of awareness could harm you or someone else. No swimming alone, no tub bathing, no hot tubs, no driving, no operating motorized vehicles (cars, ATVs, motocycles, etc), lawnmowers, power tools or firearms. No standing at heights, such as rooftops, ladders or stairs. Avoid hot objects such as stoves, heaters, open fires. Wear a helmet when riding a bicycle, scooter, skateboard, etc. and avoid areas of traffic. Set your water heater to 120 degrees or less.    Follow up in 3 months, sooner if needed   Levetiracetam tablets What is this medicine? LEVETIRACETAM (lee ve tye RA se tam) is an antiepileptic drug. It is used with other medicines to treat certain types of seizures. This medicine may be used for other purposes; ask your health care provider or pharmacist if you have questions. COMMON BRAND NAME(S): Keppra, Roweepra What should I tell my health care provider before I take this medicine? They need to know if you have any of these conditions:  kidney disease  suicidal thoughts, plans, or attempt; a previous suicide attempt by you or a family member  an unusual or allergic reaction to levetiracetam, other medicines, foods, dyes, or preservatives  pregnant or trying to get pregnant  breast-feeding How should I use this medicine? Take this medicine by mouth with a glass of water. Follow the directions on the prescription label. Swallow the tablets whole. Do not crush or chew this medicine. You may take this medicine with or without food. Take your doses at regular intervals. Do not take your medicine more often than directed. Do  not stop taking this medicine or any of your seizure medicines unless instructed by your doctor or health care professional. Stopping your medicine suddenly can increase your seizures or their severity. A special MedGuide will be given to you by the pharmacist with each prescription and refill. Be sure to read this information carefully each time. Contact your pediatrician or health care professional regarding the use of this medication in children. While this drug may be prescribed for children as young as 9 years of age for selected conditions, precautions do apply. Overdosage: If you think you have taken too much of this medicine contact a poison control center or emergency room at once. NOTE: This medicine is only for you. Do not share this medicine with others. What if I miss a dose? If you miss a dose, take it as soon as you can. If it is almost time for your next dose, take only that dose. Do not take double or extra doses. What may interact with this medicine? This medicine may interact with the following medications:  carbamazepine  colesevelam  probenecid  sevelamer This list may not describe all possible interactions. Give your health care provider a list of all the medicines, herbs, non-prescription drugs, or dietary supplements you use. Also tell them if you smoke, drink alcohol, or use illegal drugs. Some items may interact with your medicine. What should I watch for while using this medicine? Visit your doctor or health care provider for a regular check on your progress. Wear a medical identification bracelet or chain to say you have epilepsy,  and carry a card that lists all your medications. This medicine may cause serious skin reactions. They can happen weeks to months after starting the medicine. Contact your health care provider right away if you notice fevers or flu-like symptoms with a rash. The rash may be red or purple and then turn into blisters or peeling of the skin. Or,  you might notice a red rash with swelling of the face, lips or lymph nodes in your neck or under your arms. It is important to take this medicine exactly as instructed by your health care provider. When first starting treatment, your dose may need to be adjusted. It may take weeks or months before your dose is stable. You should contact your doctor or health care provider if your seizures get worse or if you have any new types of seizures. You may get drowsy or dizzy. Do not drive, use machinery, or do anything that needs mental alertness until you know how this medicine affects you. Do not stand or sit up quickly, especially if you are an older patient. This reduces the risk of dizzy or fainting spells. Alcohol may interfere with the effect of this medicine. Avoid alcoholic drinks. The use of this medicine may increase the chance of suicidal thoughts or actions. Pay special attention to how you are responding while on this medicine. Any worsening of mood, or thoughts of suicide or dying should be reported to your health care provider right away. Women who become pregnant while using this medicine may enroll in the [place] Pregnancy Registry by calling [phone]. This registry collects information about the safety of antiepileptic drug use during pregnancy. What side effects may I notice from receiving this medicine? Side effects that you should report to your doctor or health care professional as soon as possible:  allergic reactions like skin rash, itching or hives, swelling of the face, lips, or tongue  breathing problems  dark urine  general ill feeling or flu-like symptoms  problems with balance, talking, walking  rash, fever, and swollen lymph nodes  redness, blistering, peeling or loosening of the skin, including inside the mouth  unusually weak or tired  worsening of mood, thoughts or actions of suicide or dying  yellowing of the eyes or skin Side  effects that usually do not require medical attention (report to your doctor or health care professional if they continue or are bothersome):  diarrhea  dizzy, drowsy  headache  loss of appetite This list may not describe all possible side effects. Call your doctor for medical advice about side effects. You may report side effects to FDA at 1-800-FDA-1088. Where should I keep my medicine? Keep out of reach of children. Store at room temperature between 15 and 30 degrees C (59 and 86 degrees F). Throw away any unused medicine after the expiration date. NOTE: This sheet is a summary. It may not cover all possible information. If you have questions about this medicine, talk to your doctor, pharmacist, or health care provider.  2020 Elsevier/Gold Standard (2019-02-05 15:23:36)    Seizure, Adult A seizure is a sudden burst of abnormal electrical activity in the brain. Seizures usually last from 30 seconds to 2 minutes. They can cause many different symptoms. Usually, seizures are not harmful unless they last a long time. What are the causes? Common causes of this condition include:  Fever or infection.  Conditions that affect the brain, such as: ? A brain abnormality that you were born with. ? A  brain or head injury. ? Bleeding in the brain. ? A tumor. ? Stroke. ? Brain disorders such as autism or cerebral palsy.  Low blood sugar.  Conditions that are passed from parent to child (are inherited).  Problems with substances, such as: ? Having a reaction to a drug or a medicine. ? Suddenly stopping the use of a substance (withdrawal). In some cases, the cause may not be known. A person who has repeated seizures over time without a clear cause has a condition called epilepsy. What increases the risk? You are more likely to get this condition if you have:  A family history of epilepsy.  Had a seizure in the past.  A brain disorder.  A history of head injury, lack of oxygen at  birth, or strokes. What are the signs or symptoms? There are many types of seizures. The symptoms vary depending on the type of seizure you have. Examples of symptoms during a seizure include:  Shaking (convulsions).  Stiffness in the body.  Passing out (losing consciousness).  Head nodding.  Staring.  Not responding to sound or touch.  Loss of bladder control and bowel control. Some people have symptoms right before and right after a seizure happens. Symptoms before a seizure may include:  Fear.  Worry (anxiety).  Feeling like you may vomit (nauseous).  Feeling like the room is spinning (vertigo).  Feeling like you saw or heard something before (dj vu).  Odd tastes or smells.  Changes in how you see. You may see flashing lights or spots. Symptoms after a seizure happens can include:  Confusion.  Sleepiness.  Headache.  Weakness on one side of the body. How is this treated? Most seizures will stop on their own in under 5 minutes. In these cases, no treatment is needed. Seizures that last longer than 5 minutes will usually need treatment. Treatment can include:  Medicines given through an IV tube.  Avoiding things that are known to cause your seizures. These can include medicines that you take for another condition.  Medicines to treat epilepsy.  Surgery to stop the seizures. This may be needed if medicines do not help. Follow these instructions at home: Medicines  Take over-the-counter and prescription medicines only as told by your doctor.  Do not eat or drink anything that may keep your medicine from working, such as alcohol. Activity  Do not do any activities that would be dangerous if you had another seizure, like driving or swimming. Wait until your doctor says it is safe for you to do them.  If you live in the [place] ask your local DMV (department of motor vehicles) when you can drive.  Get plenty of rest. Teaching others Teach friends and  family what to do when you have a seizure. They should:  Lay you on the ground.  Protect your head and body.  Loosen any tight clothing around your neck.  Turn you on your side.  Not hold you down.  Not put anything into your mouth.  Know whether or not you need emergency care.  Stay with you until you are better.  General instructions  Contact your doctor each time you have a seizure.  Avoid anything that gives you seizures.  Keep a seizure diary. Write down: ? What you think caused each seizure. ? What you remember about each seizure.  Keep all follow-up visits as told by your doctor. This is important. Contact a doctor if:  You have another seizure.  You have seizures  more often.  There is any change in what happens during your seizures.  You keep having seizures with treatment.  You have symptoms of being sick or having an infection. Get help right away if:  You have a seizure that: ? Lasts longer than 5 minutes. ? Is different than seizures you had before. ? Makes it harder to breathe. ? Happens after you hurt your head.  You have any of these symptoms after a seizure: ? Not being able to speak. ? Not being able to use a part of your body. ? Confusion. ? A bad headache.  You have two or more seizures in a row.  You do not wake up right after a seizure.  You get hurt during a seizure. These symptoms may be an emergency. Do not wait to see if the symptoms will go away. Get medical help right away. Call your local emergency services (911 in the [place] Do not drive yourself to the hospital. Summary  Seizures usually last from 30 seconds to 2 minutes. Usually, they are not harmful unless they last a long time.  Do not eat or drink anything that may keep your medicine from working, such as alcohol.  Teach friends and family what to do when you have a seizure.  Contact your doctor each time you have a seizure. This information is not intended to  replace advice given to you by your health care provider. Make sure you discuss any questions you have with your health care provider. Document Revised: 01/22/2019 Document Reviewed: 01/22/2019 Elsevier Patient Education  [place].

## 2020-05-10 ENCOUNTER — Ambulatory Visit: Payer: Medicare Other | Admitting: Family Medicine

## 2020-05-11 ENCOUNTER — Encounter (INDEPENDENT_AMBULATORY_CARE_PROVIDER_SITE_OTHER): Payer: Self-pay | Admitting: Nurse Practitioner

## 2020-05-11 ENCOUNTER — Other Ambulatory Visit: Payer: Self-pay

## 2020-05-11 ENCOUNTER — Ambulatory Visit (INDEPENDENT_AMBULATORY_CARE_PROVIDER_SITE_OTHER): Payer: Medicare Other | Admitting: Nurse Practitioner

## 2020-05-11 ENCOUNTER — Encounter: Payer: Self-pay | Admitting: Family Medicine

## 2020-05-11 VITALS — BP 115/70 | HR 70 | Temp 97.3°F | Ht 59.0 in | Wt 116.2 lb

## 2020-05-11 DIAGNOSIS — R7303 Prediabetes: Secondary | ICD-10-CM | POA: Diagnosis not present

## 2020-05-11 DIAGNOSIS — R569 Unspecified convulsions: Secondary | ICD-10-CM | POA: Diagnosis not present

## 2020-05-11 DIAGNOSIS — E559 Vitamin D deficiency, unspecified: Secondary | ICD-10-CM | POA: Diagnosis not present

## 2020-05-11 DIAGNOSIS — E039 Hypothyroidism, unspecified: Secondary | ICD-10-CM | POA: Diagnosis not present

## 2020-05-11 NOTE — Progress Notes (Signed)
Subjective:  Patient ID: Kaitlin Jones, female    DOB: December 04, 1950  Age: 69 y.o. MRN: [id number]  CC:  Chief Complaint  Patient presents with  . Hypothyroidism  . Seizures  . Follow-up      HPI  This patient comes in today for the above.  Tells me that she had a seizure approximately 1 week ago, she was seen in the emergency department for this.  She has subsequently discussed her medication regimen with her neurologist.  Her Keppra dose was changed from 500 mg twice a day down to 250 mg twice a day.  This is because she was feeling quite fatigued on the 500 mg dose, so she stopped that medication and then subsequent seizure occurred.  She continues to have hypothyroidism and is continuing on her NP thyroid as prescribed, she tolerating this well.  She also continues taking her vitamin D3 supplement.  Last serum level was checked back in September 2020 it was 97.  She also has a history of prediabetes, back in September 2020 A1c was 5.0.  She has been working on lifestyle and behavior changes and has lost a significant amount of weight as well as improved her A1c.   Past Medical History:  Diagnosis Date  . HLD (hyperlipidemia) 08/10/2019  . Hyperlipidemia   . Hypothyroidism, adult 08/10/2019  . Osteopenia 08/10/2019  . Prediabetes 08/10/2019  . Seizures ([place]) 02/16/2020  . Vitamin D deficiency disease 08/10/2019      Family History  Problem Relation Age of Onset  . Atrial fibrillation Mother   . CVA Mother   . Stroke Mother   . Heart attack Father   . Heart disease Father 36       heart attack  . Other Brother        enlarge prostate  . Cancer Paternal Grandmother        head and neck  . Seizures Neg Hx     Social History   Social History Narrative   Never married   Lives with brother [name].   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    Social History   Tobacco Use  . Smoking status: Never Smoker  . Smokeless tobacco: Never Used  Substance Use Topics  . Alcohol  use: No     Current Meds  Medication Sig  . Calcium Carb-Cholecalciferol (CALCIUM + D3 PO) Take by mouth daily. Calcium 200 mg plus Vitamin D3 25 mcg  . Cholecalciferol (VITAMIN D3) 50 MCG (2000 UT) TABS Take 2 capsules by mouth daily.   Marland Kitchen levETIRAcetam (KEPPRA) 250 MG tablet Take 1 tablet (250 mg total) by mouth 2 (two) times daily.  Marland Kitchen thyroid (NP THYROID) 60 MG tablet Take 1 tablet (60 mg total) by mouth daily.    ROS:  Review of Systems  Constitutional: Negative for fever and malaise/fatigue.  HENT:       (-) hair loss  Eyes: Negative for blurred vision.  Respiratory: Negative for shortness of breath.   Cardiovascular: Negative for chest pain and palpitations.  Gastrointestinal: Negative for diarrhea.  Neurological: Positive for seizures. Negative for dizziness and headaches.     Objective:   Today's Vitals: BP 115/70 (BP Location: Left Arm, Patient Position: Sitting, Cuff Size: Normal)   Pulse 70   Temp (!) 97.3 F (36.3 C) (Temporal)   Ht _0  (1.499 m)   Wt 116 lb 3.2 oz (52.7 kg)   LMP 11/18/2004 (Exact Date)   SpO2 97%  BMI 23.47 kg/m  Vitals with BMI 05/11/2020 05/09/2020 05/08/2020  Height _0  - -  Weight 116 lbs 3 oz 117 lbs -  BMI 36.92 23.00 -  Systolic 979 499 718  Diastolic 70 81 94  Pulse 70 65 84     Physical Exam Vitals reviewed.  Constitutional:      General: She is not in acute distress.    Appearance: Normal appearance.  HENT:     Head: Normocephalic and atraumatic.  Neck:     Vascular: No carotid bruit.  Cardiovascular:     Rate and Rhythm: Normal rate and regular rhythm.     Pulses: Normal pulses.     Heart sounds: Normal heart sounds.  Pulmonary:     Effort: Pulmonary effort is normal.     Breath sounds: Normal breath sounds.  Skin:    General: Skin is warm and dry.     Findings: Ecchymosis present.       Neurological:     General: No focal deficit present.     Mental Status: She is alert and oriented to person, place,  and time.  Psychiatric:        Mood and Affect: Mood normal.        Behavior: Behavior normal.        Judgment: Judgment normal.          Assessment and Plan   1. Hypothyroidism, adult   2. Prediabetes   3. Vitamin D deficiency disease   4. Seizure ([place])      Plan: 1.-4.  Should continue on her chronic medication as prescribed, I encouraged her to follow-up with her neurologist as scheduled.  We will check blood work today to include thyroid panel, vitamin D level, E0V, metabolic panel.   Tests ordered Orders Placed This Encounter  Procedures  . TSH  . T3, Free  . T4, Free  . Hemoglobin A1c  . CMP with eGFR([facility])  . Vitamin D, 25-hydroxy      No orders of the defined types were placed in this encounter.   Patient to follow-up in 3 months or sooner as needed.  Ailene Ards, NP

## 2020-05-12 ENCOUNTER — Other Ambulatory Visit (INDEPENDENT_AMBULATORY_CARE_PROVIDER_SITE_OTHER): Payer: Self-pay | Admitting: Nurse Practitioner

## 2020-05-12 DIAGNOSIS — E039 Hypothyroidism, unspecified: Secondary | ICD-10-CM

## 2020-05-12 LAB — T3, FREE: T3, Free: 4.1 pg/mL (ref 2.3–4.2)

## 2020-05-12 LAB — COMPLETE METABOLIC PANEL WITH GFR
AG Ratio: 1.6 (calc) (ref 1.0–2.5)
ALT: 19 U/L (ref 6–29)
AST: 18 U/L (ref 10–35)
Albumin: 3.6 g/dL (ref 3.6–5.1)
Alkaline phosphatase (APISO): 56 U/L (ref 37–153)
BUN: 15 mg/dL (ref 7–25)
CO2: 32 mmol/L (ref 20–32)
Calcium: 9.2 mg/dL (ref 8.6–10.4)
Chloride: 109 mmol/L (ref 98–110)
Creat: 0.82 mg/dL (ref 0.50–0.99)
GFR, Est African American: 85 mL/min/{1.73_m2} (ref >=60)
GFR, Est Non African American: 74 mL/min/{1.73_m2} (ref >=60)
Globulin: 2.3 g/dL (calc) (ref 1.9–3.7)
Glucose, Bld: 91 mg/dL (ref 65–99)
Potassium: 4.8 mmol/L (ref 3.5–5.3)
Sodium: 145 mmol/L (ref 135–146)
Total Bilirubin: 0.2 mg/dL (ref 0.2–1.2)
Total Protein: 5.9 g/dL — ABNORMAL LOW (ref 6.1–8.1)

## 2020-05-12 LAB — T4, FREE: Free T4: 1.1 ng/dL (ref 0.8–1.8)

## 2020-05-12 LAB — HEMOGLOBIN A1C
Hgb A1c MFr Bld: 5 % of total Hgb (ref <5.7)
Mean Plasma Glucose: 97 (calc)
eAG (mmol/L): 5.4 (calc)

## 2020-05-12 LAB — LEVETIRACETAM LEVEL: Levetiracetam Lvl: <1 ug/mL — ABNORMAL LOW (ref 10.0–40.0)

## 2020-05-12 LAB — TSH: TSH: <0.01 mIU/L — ABNORMAL LOW (ref 0.40–4.50)

## 2020-05-12 LAB — VITAMIN D 25 HYDROXY (VIT D DEFICIENCY, FRACTURES): Vit D, 25-Hydroxy: 78 ng/mL (ref 30–100)

## 2020-05-12 MED ORDER — THYROID 30 MG PO TABS: 30.0000 mg | ORAL_TABLET | Freq: Every day | ORAL | 2 refills | Status: DC

## 2020-05-17 ENCOUNTER — Other Ambulatory Visit (INDEPENDENT_AMBULATORY_CARE_PROVIDER_SITE_OTHER): Payer: Self-pay | Admitting: Internal Medicine

## 2020-06-01 ENCOUNTER — Telehealth: Payer: Self-pay | Admitting: Neurology

## 2020-06-01 NOTE — Telephone Encounter (Signed)
LVM requesting call back.

## 2020-06-01 NOTE — Telephone Encounter (Signed)
Patient returned call, gave her Kaitlin Jones results. She stated she will continue taking levetiracetam as prescribed. I advised she call before FU with any questions or concerns. Patient verbalized understanding, appreciation.

## 2020-06-01 NOTE — Telephone Encounter (Signed)
Please let patient know that the prolonged video EEG did show findings consistent with left frontotemporal sharp wave activity, this is something that we see with an epilepsy focus, this places patient at increased risk for focal epilepsy.  I think at this time she has to stay on her antiepilepsy medications, follow-up with [place] in the office in September thanks.

## 2020-06-19 ENCOUNTER — Ambulatory Visit: Payer: Medicare Other | Admitting: Neurology

## 2020-06-26 ENCOUNTER — Other Ambulatory Visit (INDEPENDENT_AMBULATORY_CARE_PROVIDER_SITE_OTHER): Payer: Medicare Other

## 2020-06-26 LAB — TSH: TSH: 0.01 mIU/L — ABNORMAL LOW (ref 0.40–4.50)

## 2020-06-26 LAB — T3, FREE: T3, Free: 4.1 pg/mL (ref 2.3–4.2)

## 2020-06-26 LAB — T4, FREE: Free T4: 1.1 ng/dL (ref 0.8–1.8)

## 2020-06-28 ENCOUNTER — Telehealth: Payer: Self-pay | Admitting: Family Medicine

## 2020-06-28 NOTE — Telephone Encounter (Signed)
Will talk to Dr Jaynee Eagles when she come in for her appt.

## 2020-06-28 NOTE — Telephone Encounter (Signed)
Pt states since she has been diagnosed with Epilepsy she would like to know if Dr Jaynee Eagles thinks she should get a Probation officer, please call

## 2020-07-19 DIAGNOSIS — H2512 Age-related nuclear cataract, left eye: Secondary | ICD-10-CM | POA: Diagnosis not present

## 2020-07-27 ENCOUNTER — Ambulatory Visit: Payer: Medicare Other | Admitting: Neurology

## 2020-07-27 ENCOUNTER — Encounter: Payer: Self-pay | Admitting: Neurology

## 2020-07-27 ENCOUNTER — Other Ambulatory Visit: Payer: Self-pay

## 2020-07-27 VITALS — BP 132/66 | HR 63 | Ht 59.0 in | Wt 116.0 lb

## 2020-07-27 DIAGNOSIS — G40209 Localization-related (focal) (partial) symptomatic epilepsy and epileptic syndromes with complex partial seizures, not intractable, without status epilepticus: Secondary | ICD-10-CM | POA: Diagnosis not present

## 2020-07-27 NOTE — Patient Instructions (Signed)
Per [place] DMV statutes, patients with seizures are not allowed to drive until they have been seizure-free for six months.    Use caution when using heavy equipment or power tools. Avoid working on ladders or at heights. Take showers instead of baths. Ensure the water temperature is not too high on the home water heater. Do not go swimming alone. Do not lock yourself in a room alone (i.e. bathroom). When caring for infants or small children, sit down when holding, feeding, or changing them to minimize risk of injury to the child in the event you have a seizure. Maintain good sleep hygiene. Avoid alcohol.    If patient has another seizure, call 911 and bring them back to the ED if: A.  The seizure lasts longer than 5 minutes.      B.  The patient doesn't wake shortly after the seizure or has new problems such as difficulty seeing, speaking or moving following the seizure C.  The patient was injured during the seizure D.  The patient has a temperature over 102 F (39C) E.  The patient vomited during the seizure and now is having trouble breathing  Per [place] DMV statutes, patients with seizures are not allowed to drive until they have been seizure-free for six months.  Other recommendations include using caution when using heavy equipment or power tools. Avoid working on ladders or at heights. Take showers instead of baths.  Do not swim alone.  Ensure the water temperature is not too high on the home water heater. Do not go swimming alone. Do not lock yourself in a room alone (i.e. bathroom). When caring for infants or small children, sit down when holding, feeding, or changing them to minimize risk of injury to the child in the event you have a seizure. Maintain good sleep hygiene. Avoid alcohol.  Also recommend adequate sleep, hydration, good diet and minimize stress.  During the Seizure  - First, ensure adequate ventilation and place patients on the floor on their left side  Loosen  clothing around the neck and ensure the airway is patent. If the patient is clenching the teeth, do not force the mouth open with any object as this can cause severe damage - Remove all items from the surrounding that can be hazardous. The patient may be oblivious to what's happening and may not even know what he or she is doing. If the patient is confused and wandering, either gently guide him/her away and block access to outside areas - Reassure the individual and be comforting - Call 911. In most cases, the seizure ends before EMS arrives. However, there are cases when seizures may last over 3 to 5 minutes. Or the individual may have developed breathing difficulties or severe injuries. If a pregnant patient or a person with diabetes develops a seizure, it is prudent to call an ambulance. - Finally, if the patient does not regain full consciousness, then call EMS. Most patients will remain confused for about 45 to 90 minutes after a seizure, so you must use judgment in calling for help. - Avoid restraints but make sure the patient is in a bed with padded side rails - Place the individual in a lateral position with the neck slightly flexed; this will help the saliva drain from the mouth and prevent the tongue from falling backward - Remove all nearby furniture and other hazards from the area - Provide verbal assurance as the individual is regaining consciousness - Provide the patient with privacy   if possible - Call for help and start treatment as ordered by the caregiver   fter the Seizure (Postictal Stage)  After a seizure, most patients experience confusion, fatigue, muscle pain and/or a headache. Thus, one should permit the individual to sleep. For the next few days, reassurance is essential. Being calm and helping reorient the person is also of importance.  Most seizures are painless and end spontaneously. Seizures are not harmful to others but can lead to complications such as stress on the  lungs, brain and the heart. Individuals with prior lung problems may develop labored breathing and respiratory distress.   

## 2020-07-27 NOTE — Progress Notes (Signed)
[facility]    Provider:  Dr Jaynee Eagles Requesting Provider: Hurshel Party MD Primary Care Provider:  Doree Albee, MD  CC:  Seizure  Interval history July 27, 2020: Patient had an MRI of the brain and EEG routine that were normal, unfortunately she stopped her seizure medication and when she was last seen on May 11, 2020 she reported she had stopped her Keppra 500 twice daily, unfortunately she was then seen in the emergency room in June 23 for second seizure-like event, she was gurgling, shaking and seeming to be having trouble breathing.  The prolonged video EEG did show findings consistent with left frontotemporal sharp wave activity, consistent with an epilepsy focus, putting patient at increased risk for focal epilepsy, and we highly encouraged her to stay on her epilepsy medication.   We spoke today about several questions she had, such as can she work which I assured her she could, we discussed limitations such as not swimming alone, not driving until 6 months seizure-free, I would not recommend working at heights or doing anything at work might hurt herself or others should she have a seizure, she also asked if she had to notify work that she had seizures work with a fire her: I stated it would be illegal for them to fire her because she had seizures and I did recommend that for her safety and other safety that she consider informing her employer about her seizures however that is completely her choice how much of her medical history to share based on the type of employment which I do not know (for example if she were a truck driver she would probably have to disclose her seizures) however I do not think that she is doing anything such as driving trucks or using heavy equipment, we discussed it probably be a good idea for her to get a medical alert bracelet, we reviewed some of them online and some of the information she might want to put on it, she did ask if this  would shorten her life span and we did discuss sudden unexpected death in epilepsy patients however that is rare, she want to know if it will get worse and I could not answer that sometimes even with adequate medication she can have seizures and we will need to adjust but that is why we wait at least 6 months before she drives and that is why we put precautions on her.  I did recommend if she cannot tolerate Keppra 500 twice daily that we should consider changing her medications as 250 twice a day is not considered therapeutic however may be therapeutic in her case is hard to know, she declined, she like to stay on it, if she does have another seizure she will agree to change medications.   HPI:  Kaitlin Jones is a 69 y.o. female here as requested by Dr. Anastasio Champion for seizure.  Past medical history hypothyroidism, hyperlipidemia, prediabetes.  I reviewed Dr. Lanice Shirts notes, patient recently had an episode of went to the emergency room approximately 3 weeks prior to her last appointment (last seen February 08, 2020) she had loss of consciousness, shaking of limbs and confusion postictally.  She was told not to drive for 6 months.  No further episodes.  CT of the head did not show any major abnormalities.  I also reviewed emergency room notes from January 18, 2020, patient arrived by EMS after her brother who lives with her heard her gurgling, she was less responsive than usual,  she had blood in her shirt and a small abrasion on the right side of her tongue, patient did not know why she was in the emergency room and could not remember anything that happened, she was a poor historian however she did attempt to answer some questions.  Brother described her seizure as tonic-clonic activity for about 1 minute then sleepiness afterwards, he had never seen her have a seizure previously, she was recently started on thyroid medication for hypothyroidism.  I reviewed labs which showed unremarkable CBC, CMP, ethanol was negative,  urine did not show infection, culture showed no growth, urine rapid drug screen was negative, she was discharged home when stable.  Patient was in her usual state of health, she had been doing house work, no illness, not sick, no fever, no changes in medications, nothing unusual had happened. She laid down to rest and she doesn't remember anything, her brother who is here provides information, he heard some labored breathing and he saw her eyes were rolled back in her head, she bit her tongue, she was unconscious and had turned white, he called 911 and EMS took her to the hospital while she was fighting them and she doesn't remember.She remembers the ED. She has never had a seizure in the past. No family history of seizures. No episodes since then. No other focal neurologic deficits, associated symptoms, inciting events or modifiable factors.  Reviewed notes, labs and imaging from outside physicians, which showed:  T4 Free normal  Personally reviewed CT head 01/18/2020 and agree with the following: IMPRESSION: 1. No acute intracranial findings. 2. Features of mild chronic microvascular angiopathy and parenchymal volume loss.  Review of Systems: Patient complains of symptoms per HPI as well as the following symptoms: seizure, memory changes since seizure. Pertinent negatives and positives per HPI. All others negative.   Social History   Socioeconomic History  . Marital status: Single    Spouse name: Not on file  . Number of children: 0  . Years of education: 49  . Highest education level: Not on file  Occupational History  . Occupation: nursing  . Occupation: computers  Tobacco Use  . Smoking status: Never Smoker  . Smokeless tobacco: Never Used  Vaping Use  . Vaping Use: Never used  Substance and Sexual Activity  . Alcohol use: No  . Drug use: No  . Sexual activity: Never    Comment: Never  Other Topics Concern  . Not on file  Social History Narrative   Never married   Lives  with brother [name].   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    Caffeine: none    Social Determinants of Health   Financial Resource Strain:   . Difficulty of Paying Living Expenses: Not on file  Food Insecurity:   . Worried About Charity fundraiser in the Last Year: Not on file  . Ran Out of Food in the Last Year: Not on file  Transportation Needs:   . Lack of Transportation (Medical): Not on file  . Lack of Transportation (Non-Medical): Not on file  Physical Activity:   . Days of Exercise per Week: Not on file  . Minutes of Exercise per Session: Not on file  Stress:   . Feeling of Stress : Not on file  Social Connections:   . Frequency of Communication with Friends and Family: Not on file  . Frequency of Social Gatherings with Friends and Family: Not on file  . Attends Religious Services:  Not on file  . Active Member of Clubs or Organizations: Not on file  . Attends Archivist Meetings: Not on file  . Marital Status: Not on file  Intimate Partner Violence:   . Fear of Current or Ex-Partner: Not on file  . Emotionally Abused: Not on file  . Physically Abused: Not on file  . Sexually Abused: Not on file    Family History  Problem Relation Age of Onset  . Atrial fibrillation Mother   . CVA Mother   . Stroke Mother   . Heart attack Father   . Heart disease Father 55       heart attack  . Other Brother        enlarge prostate  . Cancer Paternal Grandmother        head and neck  . Seizures Neg Hx     Past Medical History:  Diagnosis Date  . HLD (hyperlipidemia) 08/10/2019  . Hyperlipidemia   . Hypothyroidism, adult 08/10/2019  . Osteopenia 08/10/2019  . Prediabetes 08/10/2019  . Seizures ([place]) 02/16/2020  . Vitamin D deficiency disease 08/10/2019    Patient Active Problem List   Diagnosis Date Noted  . Partial symptomatic epilepsy with complex partial seizures, not intractable, without status epilepticus ([place]) 07/30/2020  . Seizures ([place]) 02/16/2020  .  Hypothyroidism, adult 08/10/2019  . Vitamin D deficiency disease 08/10/2019  . Prediabetes 08/10/2019  . HLD (hyperlipidemia) 08/10/2019  . Osteopenia 08/10/2019  . Family history of premature coronary heart disease 08/26/2016    Past Surgical History:  Procedure Laterality Date  . CATARACT EXTRACTION Bilateral    07/05/20 and 07/19/20  . CHOLECYSTECTOMY  10/1986  . COLONOSCOPY N/A 03/06/2020   Procedure: COLONOSCOPY;  Surgeon: [name], MD;  [facility];  Service: Endoscopy;  Laterality: N/A;  8:30  . POLYPECTOMY  03/06/2020   Procedure: POLYPECTOMY;  Surgeon: [name], MD;  Location: [facility];  Service: Endoscopy;;    Current Outpatient Medications  Medication Sig Dispense Refill  . Calcium Carb-Cholecalciferol (CALCIUM + D3 PO) Take by mouth daily. Calcium 1200 mg    . Cholecalciferol (VITAMIN D3) 50 MCG (2000 UT) TABS Take 2 capsules by mouth daily.     Marland Kitchen levETIRAcetam (KEPPRA) 250 MG tablet Take 1 tablet (250 mg total) by mouth 2 (two) times daily. 180 tablet 3  . thyroid (NP THYROID) 30 MG tablet Take 1 tablet (30 mg total) by mouth daily. 30 tablet 2   No current facility-administered medications for this visit.    Allergies as of 07/27/2020  . (No Known Allergies)    Vitals: BP 132/66 (BP Location: Right Arm, Patient Position: Sitting)   Pulse 63   Ht 4\' 11"  (1.499 m)   Wt 116 lb (52.6 kg)   LMP 11/18/2004 (Exact Date)   BMI 23.43 kg/m  Last Weight:  Wt Readings from Last 1 Encounters:  07/27/20 116 lb (52.6 kg)   Last Height:   Ht Readings from Last 1 Encounters:  07/27/20 4\' 11"  (1.499 m)     Physical exam: Exam: Gen: NAD, conversant, well nourised, well groomed                     CV: RRR, no MRG. No Carotid Bruits. No peripheral edema, warm, nontender Eyes: Conjunctivae clear without exudates or hemorrhage  Neuro: Detailed Neurologic Exam  Speech:    Speech is normal; fluent and spontaneous with normal comprehension.   Cognition:  The patient is oriented to person, place, and time;     recent and remote memory intact;     language fluent;     normal attention, concentration,     fund of knowledge Cranial Nerves:    The pupils are equal, round, and reactive to light. The fundi are flat. Visual fields are full to finger confrontation. Extraocular movements are intact. Trigeminal sensation is intact and the muscles of mastication are normal. The face is symmetric. The palate elevates in the midline. Hearing intact. Voice is normal. Shoulder shrug is normal. The tongue has normal motion without fasciculations.   Coordination:    No dysmetria   Gait:  normal native gait  Motor Observation:    No asymmetry, no atrophy, and no involuntary movements noted. Tone:    Normal muscle tone.    Posture:    Posture is normal. normal erect    Strength:    Strength is V/V in the upper and lower limbs.      Sensation: intact to LT     Reflex Exam:  DTR's:    Deep tendon reflexes in the upper and lower extremities are normal bilaterally.   Toes:    The toes are equiv bilaterally.   Clonus:    Clonus is absent.    Assessment/Plan:  Absolutely lovely patient with an unprovoked generalized seizure, EEG did show epileptiform activity.  I recommend continuing antiepilepsy medications for life.  She cannot tolerate anything higher than Keppra 250 twice daily, I would like to have her on at least 500 twice daily but she declines and she declines changing medications at this time.  250 twice a day may be therapeutic for her is hard to know, I did warn her about repeat breakthrough seizures.  Discussed: Per [facility] statutes, patients with seizures are not allowed to drive until they have been seizure-free for six months.    Use caution when using heavy equipment or power tools. Avoid working on ladders or at heights. Take showers instead of baths. Ensure the water temperature is not too high on the home  water heater. Do not go swimming alone. Do not lock yourself in a room alone (i.e. bathroom). When caring for infants or small children, sit down when holding, feeding, or changing them to minimize risk of injury to the child in the event you have a seizure. Maintain good sleep hygiene. Avoid alcohol.    If patient has another seizure, call 911 and bring them back to the ED if: A.  The seizure lasts longer than 5 minutes.      B.  The patient doesn't wake shortly after the seizure or has new problems such as difficulty seeing, speaking or moving following the seizure C.  The patient was injured during the seizure D.  The patient has a temperature over 102 F (39C) E.  The patient vomited during the seizure and now is having trouble breathing  Per [facility] statutes, patients with seizures are not allowed to drive until they have been seizure-free for six months.  Other recommendations include using caution when using heavy equipment or power tools. Avoid working on ladders or at heights. Take showers instead of baths.  Do not swim alone.  Ensure the water temperature is not too high on the home water heater. Do not go swimming alone. Do not lock yourself in a room alone (i.e. bathroom). When caring for infants or small children, sit down when holding, feeding, or changing  them to minimize risk of injury to the child in the event you have a seizure. Maintain good sleep hygiene. Avoid alcohol.  Also recommend adequate sleep, hydration, good diet and minimize stress.  During the Seizure  - First, ensure adequate ventilation and place patients on the floor on their left side  Loosen clothing around the neck and ensure the airway is patent. If the patient is clenching the teeth, do not force the mouth open with any object as this can cause severe damage - Remove all items from the surrounding that can be hazardous. The patient may be oblivious to what's happening and may not even know what he or  she is doing. If the patient is confused and wandering, either gently guide him/her away and block access to outside areas - Reassure the individual and be comforting - Call 911. In most cases, the seizure ends before EMS arrives. However, there are cases when seizures may last over 3 to 5 minutes. Or the individual may have developed breathing difficulties or severe injuries. If a pregnant patient or a person with diabetes develops a seizure, it is prudent to call an ambulance. - Finally, if the patient does not regain full consciousness, then call EMS. Most patients will remain confused for about 45 to 90 minutes after a seizure, so you must use judgment in calling for help. - Avoid restraints but make sure the patient is in a bed with padded side rails - Place the individual in a lateral position with the neck slightly flexed; this will help the saliva drain from the mouth and prevent the tongue from falling backward - Remove all nearby furniture and other hazards from the area - Provide verbal assurance as the individual is regaining consciousness - Provide the patient with privacy if possible - Call for help and start treatment as ordered by the caregiver   fter the Seizure (Postictal Stage)  After a seizure, most patients experience confusion, fatigue, muscle pain and/or a headache. Thus, one should permit the individual to sleep. For the next few days, reassurance is essential. Being calm and helping reorient the person is also of importance.  Most seizures are painless and end spontaneously. Seizures are not harmful to others but can lead to complications such as stress on the lungs, brain and the heart. Individuals with prior lung problems may develop labored breathing and respiratory distress.    No orders of the defined types were placed in this encounter.  Meds ordered this encounter  Medications  . levETIRAcetam (KEPPRA) 250 MG tablet    Sig: Take 1 tablet (250 mg total) by mouth  2 (two) times daily.    Dispense:  180 tablet    Refill:  3   I spent 30 minutes of face-to-face and non-face-to-face time with patient on the  1. Partial symptomatic epilepsy with complex partial seizures, not intractable, without status epilepticus ([place])    diagnosis.  This included previsit chart review, lab review, study review, order entry, electronic health record documentation, patient education on the different diagnostic and therapeutic options, counseling and coordination of care, risks and benefits of management, compliance, or risk factor reduction   Cc: Ailene Ards, NP,  Doree Albee, MD  Sarina Ill, MD  [facility] [address] [place], [place] [address]  Phone [phone] Fax [phone]

## 2020-07-30 DIAGNOSIS — G40209 Localization-related (focal) (partial) symptomatic epilepsy and epileptic syndromes with complex partial seizures, not intractable, without status epilepticus: Secondary | ICD-10-CM | POA: Insufficient documentation

## 2020-07-30 MED ORDER — LEVETIRACETAM 250 MG PO TABS: 250.0000 mg | ORAL_TABLET | Freq: Two times a day (BID) | ORAL | 3 refills | Status: DC

## 2020-08-15 ENCOUNTER — Encounter (INDEPENDENT_AMBULATORY_CARE_PROVIDER_SITE_OTHER): Payer: Self-pay | Admitting: Nurse Practitioner

## 2020-08-15 ENCOUNTER — Other Ambulatory Visit: Payer: Self-pay

## 2020-08-15 ENCOUNTER — Ambulatory Visit (INDEPENDENT_AMBULATORY_CARE_PROVIDER_SITE_OTHER): Payer: Medicare Other | Admitting: Nurse Practitioner

## 2020-08-15 VITALS — BP 122/68 | HR 77 | Temp 97.7°F | Resp 12 | Ht 59.0 in | Wt 116.6 lb

## 2020-08-15 DIAGNOSIS — E559 Vitamin D deficiency, unspecified: Secondary | ICD-10-CM

## 2020-08-15 DIAGNOSIS — E039 Hypothyroidism, unspecified: Secondary | ICD-10-CM | POA: Diagnosis not present

## 2020-08-15 DIAGNOSIS — R569 Unspecified convulsions: Secondary | ICD-10-CM | POA: Diagnosis not present

## 2020-08-15 NOTE — Progress Notes (Signed)
Subjective:  Patient ID: Kaitlin Jones, female    DOB: [birth year]  Age: 69 y.o. MRN: [id number]  CC:  Chief Complaint  Patient presents with  . Other    Seizures, vitamin D deficiency  . Hypothyroidism      HPI  This patient arrives today for the above.  She has recent onset of seizures this year.  Last seizure was in June of this year.  She continues on Keppra 250 mg tablet twice a day.  She is tolerating this medication well.  When she was on 500 mg twice a day in the past it made her very somnolent.  She ended up stopping the Keppra on her own when she was on the 500 mg dose and this resulted in her having another seizure.  She was then placed on 250 mg twice daily has been tolerating this well ever since.  Today she tells me that she does have an infrequent, intermittent throbbing sensation in her head.  She denies any hearing changes such as whooshing or tinnitus.  She is concerned if this could be related to possible impending seizure.  She tells me she does not remember ever having a headache type of aura prior to her previous seizures.  She also has a history of vitamin D deficiency and continues on vitamin D3 tablet.  She is due to have serum check today.  She does have a history of hypothyroidism and is on NP thyroid 30 mg tablet by mouth daily.  She has been tolerating this medication well.  It was reduced from 60 mg at last office visit due to her TSH being quite low at that time.  Past Medical History:  Diagnosis Date  . HLD (hyperlipidemia) 08/10/2019  . Hyperlipidemia   . Hypothyroidism, adult 08/10/2019  . Osteopenia 08/10/2019  . Prediabetes 08/10/2019  . Seizures ([place]) 02/16/2020  . Vitamin D deficiency disease 08/10/2019      Family History  Problem Relation Age of Onset  . Atrial fibrillation Mother   . CVA Mother   . Stroke Mother   . Heart attack Father   . Heart disease Father 64       heart attack  . Other Brother        enlarge prostate  . Cancer  Paternal Grandmother        head and neck  . Seizures Neg Hx     Social History   Social History Narrative   Never married   Lives with brother [name].   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    Caffeine: none    Social History   Tobacco Use  . Smoking status: Never Smoker  . Smokeless tobacco: Never Used  Substance Use Topics  . Alcohol use: No     Current Meds  Medication Sig  . Calcium Carb-Cholecalciferol (CALCIUM + D3 PO) Take by mouth daily. Calcium 1200 mg  . Cholecalciferol (VITAMIN D3) 50 MCG (2000 UT) TABS Take 2 capsules by mouth daily.   Marland Kitchen levETIRAcetam (KEPPRA) 250 MG tablet Take 1 tablet (250 mg total) by mouth 2 (two) times daily.  Marland Kitchen thyroid (NP THYROID) 30 MG tablet Take 1 tablet (30 mg total) by mouth daily.    ROS:  Review of Systems  Constitutional: Negative.   Respiratory: Negative for shortness of breath.   Cardiovascular: Negative for chest pain and palpitations.  Gastrointestinal: Negative.   Musculoskeletal: Negative.   Neurological: Positive for headaches. Negative for dizziness, seizures  and loss of consciousness.  Psychiatric/Behavioral: Negative.      Objective:   Today's Vitals: BP 122/68   Pulse 77   Temp 97.7 F (36.5 C)   Resp 12   Ht '4\' 11"'  (1.499 m)   Wt 116 lb 9.6 oz (52.9 kg)   LMP 11/18/2004 (Exact Date)   SpO2 97%   BMI 23.55 kg/m  Vitals with BMI 08/15/2020 07/27/2020 05/11/2020  Height '4\' 11"'  '4\' 11"'  '4\' 11"'   Weight 116 lbs 10 oz 116 lbs 116 lbs 3 oz  BMI 23.54 16.60 63.01  Systolic 601 093 235  Diastolic 68 66 70  Pulse 77 63 70     Physical Exam Vitals reviewed.  Constitutional:      General: She is not in acute distress.    Appearance: Normal appearance.  HENT:     Head: Normocephalic and atraumatic.  Neck:     Vascular: No carotid bruit.  Cardiovascular:     Rate and Rhythm: Normal rate and regular rhythm.     Pulses: Normal pulses.     Heart sounds: Normal heart sounds.  Pulmonary:     Effort: Pulmonary  effort is normal.     Breath sounds: Normal breath sounds.  Skin:    General: Skin is warm and dry.  Neurological:     General: No focal deficit present.     Mental Status: She is alert and oriented to person, place, and time.  Psychiatric:        Mood and Affect: Mood normal.        Behavior: Behavior normal.        Judgment: Judgment normal.          Assessment and Plan   1. Hypothyroidism, adult   2. Vitamin D deficiency   3. Seizure ([place])      Plan: 1.  We will check thyroid panel today for further evaluation.  She will continue on dose of NP thyroid unless further recommendations are made based upon lab results. 2.  We will check CMP and vitamin D level for further evaluation today.  She will continue on current dose of vitamin D3 and calcium supplement unless further recommendations are made based on lab work. 3.  I did reach out to her neurologist today regarding the patient's concerns of throbbing sensation in her head and possible relation to seizures.  The patient's neurologist does not feel that this symptom can necessarily be attributed to increased risk of seizure.  She did mention that if the patient is having pulsatile tinnitus to consider getting CTA of the head to rule out aneurysm.  Patient denies noting any auditory changes with the throbbing.  She was told that if she starts to experience any worsening/ringing in her ears, or if throbbing progresses to pain, or becomes more persistent then we may need to move forward with imaging.  The patient was told to call this office if any of these symptoms arise, she tells me she understands.   Tests ordered Orders Placed This Encounter  Procedures  . For home use only DME Other see comment  . TSH  . T3, Free  . T4, Free  . Vitamin D, 25-hydroxy  . CMP with eGFR([facility])  . AMB Referral to [place] [facility]      No orders of the defined types were placed in this encounter.   Patient to follow-up in 3 months  or sooner as needed.  Ailene Ards, NP

## 2020-08-16 ENCOUNTER — Other Ambulatory Visit (INDEPENDENT_AMBULATORY_CARE_PROVIDER_SITE_OTHER): Payer: Self-pay | Admitting: Nurse Practitioner

## 2020-08-16 DIAGNOSIS — E875 Hyperkalemia: Secondary | ICD-10-CM

## 2020-08-16 DIAGNOSIS — Z961 Presence of intraocular lens: Secondary | ICD-10-CM | POA: Diagnosis not present

## 2020-08-16 LAB — COMPLETE METABOLIC PANEL WITH GFR
AG Ratio: 1.5 (calc) (ref 1.0–2.5)
ALT: 14 U/L (ref 6–29)
AST: 20 U/L (ref 10–35)
Albumin: 3.7 g/dL (ref 3.6–5.1)
Alkaline phosphatase (APISO): 61 U/L (ref 37–153)
BUN: 10 mg/dL (ref 7–25)
CO2: 35 mmol/L — ABNORMAL HIGH (ref 20–32)
Calcium: 9.8 mg/dL (ref 8.6–10.4)
Chloride: 106 mmol/L (ref 98–110)
Creat: 0.89 mg/dL (ref 0.50–0.99)
GFR, Est African American: 77 mL/min/{1.73_m2} (ref >=60)
GFR, Est Non African American: 67 mL/min/{1.73_m2} (ref >=60)
Globulin: 2.4 g/dL (calc) (ref 1.9–3.7)
Glucose, Bld: 111 mg/dL — ABNORMAL HIGH (ref 65–99)
Potassium: 5.4 mmol/L — ABNORMAL HIGH (ref 3.5–5.3)
Sodium: 145 mmol/L (ref 135–146)
Total Bilirubin: 0.3 mg/dL (ref 0.2–1.2)
Total Protein: 6.1 g/dL (ref 6.1–8.1)

## 2020-08-16 LAB — VITAMIN D 25 HYDROXY (VIT D DEFICIENCY, FRACTURES): Vit D, 25-Hydroxy: 85 ng/mL (ref 30–100)

## 2020-08-16 LAB — T4, FREE: Free T4: 1 ng/dL (ref 0.8–1.8)

## 2020-08-16 LAB — T3, FREE: T3, Free: 3.9 pg/mL (ref 2.3–4.2)

## 2020-08-16 LAB — TSH: TSH: 0.01 mIU/L — ABNORMAL LOW (ref 0.40–4.50)

## 2020-08-16 NOTE — Progress Notes (Signed)
Repeat cmp to monitor potassium level

## 2020-08-16 NOTE — Progress Notes (Signed)
Appt 10/16 for labs

## 2020-08-18 ENCOUNTER — Other Ambulatory Visit (INDEPENDENT_AMBULATORY_CARE_PROVIDER_SITE_OTHER): Payer: Self-pay | Admitting: Nurse Practitioner

## 2020-08-18 DIAGNOSIS — E039 Hypothyroidism, unspecified: Secondary | ICD-10-CM

## 2020-08-30 ENCOUNTER — Encounter (INDEPENDENT_AMBULATORY_CARE_PROVIDER_SITE_OTHER): Payer: Self-pay | Admitting: Nurse Practitioner

## 2020-08-30 ENCOUNTER — Other Ambulatory Visit (INDEPENDENT_AMBULATORY_CARE_PROVIDER_SITE_OTHER): Payer: Self-pay | Admitting: Nurse Practitioner

## 2020-08-30 ENCOUNTER — Other Ambulatory Visit: Payer: Self-pay

## 2020-08-30 ENCOUNTER — Other Ambulatory Visit (INDEPENDENT_AMBULATORY_CARE_PROVIDER_SITE_OTHER): Payer: Medicare Other

## 2020-08-30 ENCOUNTER — Telehealth (INDEPENDENT_AMBULATORY_CARE_PROVIDER_SITE_OTHER): Payer: Self-pay | Admitting: Nurse Practitioner

## 2020-08-30 DIAGNOSIS — Z7189 Other specified counseling: Secondary | ICD-10-CM

## 2020-08-30 DIAGNOSIS — E875 Hyperkalemia: Secondary | ICD-10-CM | POA: Diagnosis not present

## 2020-08-30 NOTE — Addendum Note (Signed)
Addended by: Jeralyn Ruths E on: 08/30/2020 12:11 PM   Modules accepted: Orders

## 2020-08-30 NOTE — Telephone Encounter (Signed)
Please call this patient back and let her know that I received her message from Santiago Glad.  She stated that she wanted to cancel her lab appointment to check her potassium levels, last lab check showed she had a potassium level >5.0.  Let her know, that if potassium gets too high it can be life-threatening and can lead to her heart stopping and sudden death.  This is why this needs to be checked because of it does not return to normal something needs to be done to fix it.  I would recommend that she stop eating a banana every night, and then come back into the office to get her potassium level rechecked.  This is so that I can determine whether or not she is in a life-threatening situation.  Make sure before she comes to have her blood checked she is well-hydrated, thus recommend she drinks plenty of fluid before she comes in.  If she refuses to come in for the blood test, let her know this is considered AGAINST MEDICAL ADVICE. Let me know her response. Thank you.

## 2020-08-30 NOTE — Telephone Encounter (Signed)
Patient came in for her lab test and this message was discussed with patient while she was in the office for her lab. Patient verbalized an understanding.

## 2020-08-30 NOTE — Telephone Encounter (Signed)
Called and spoke to Mr. Woodhams, patients brother, and patient was not home at the moment. I will try back again.

## 2020-08-30 NOTE — Progress Notes (Signed)
This encounter was created in error - please disregard.

## 2020-08-30 NOTE — Progress Notes (Signed)
This patient came in today for blood work.  She tells me that she is concerned about her brother, for whom she is currently the main caregiver.  She has been having some health issues including seizures over the last year, and is concerned that if she were to pass away what would happen to him.  She tells me that he does require assistance with IADLs, and would like help navigating the legal and medical system to complete healthcare power of attorney and possibly setting up assistance for him in the event that she passes away.  I will order referral to case management today for further assistance with this.

## 2020-08-31 LAB — COMPLETE METABOLIC PANEL WITH GFR
AG Ratio: 1.3 (calc) (ref 1.0–2.5)
ALT: 17 U/L (ref 6–29)
AST: 31 U/L (ref 10–35)
Albumin: 3.7 g/dL (ref 3.6–5.1)
Alkaline phosphatase (APISO): 60 U/L (ref 37–153)
BUN: 12 mg/dL (ref 7–25)
CO2: 31 mmol/L (ref 20–32)
Calcium: 10.1 mg/dL (ref 8.6–10.4)
Chloride: 105 mmol/L (ref 98–110)
Creat: 0.8 mg/dL (ref 0.50–0.99)
GFR, Est African American: 87 mL/min/{1.73_m2} (ref >=60)
GFR, Est Non African American: 75 mL/min/{1.73_m2} (ref >=60)
Globulin: 2.9 g/dL (calc) (ref 1.9–3.7)
Glucose, Bld: 71 mg/dL (ref 65–99)
Potassium: 4.8 mmol/L (ref 3.5–5.3)
Sodium: 142 mmol/L (ref 135–146)
Total Bilirubin: 0.4 mg/dL (ref 0.2–1.2)
Total Protein: 6.6 g/dL (ref 6.1–8.1)

## 2020-11-07 DIAGNOSIS — Z961 Presence of intraocular lens: Secondary | ICD-10-CM | POA: Diagnosis not present

## 2020-11-21 ENCOUNTER — Telehealth (INDEPENDENT_AMBULATORY_CARE_PROVIDER_SITE_OTHER): Payer: Medicare Other | Admitting: Nurse Practitioner

## 2020-11-21 ENCOUNTER — Encounter (INDEPENDENT_AMBULATORY_CARE_PROVIDER_SITE_OTHER): Payer: Self-pay | Admitting: Nurse Practitioner

## 2020-11-21 ENCOUNTER — Telehealth (INDEPENDENT_AMBULATORY_CARE_PROVIDER_SITE_OTHER): Payer: Self-pay

## 2020-11-21 VITALS — Ht 59.0 in | Wt 118.0 lb

## 2020-11-21 DIAGNOSIS — R7303 Prediabetes: Secondary | ICD-10-CM

## 2020-11-21 DIAGNOSIS — E039 Hypothyroidism, unspecified: Secondary | ICD-10-CM

## 2020-11-21 DIAGNOSIS — R569 Unspecified convulsions: Secondary | ICD-10-CM | POA: Diagnosis not present

## 2020-11-21 DIAGNOSIS — E559 Vitamin D deficiency, unspecified: Secondary | ICD-10-CM

## 2020-11-21 DIAGNOSIS — E782 Mixed hyperlipidemia: Secondary | ICD-10-CM | POA: Diagnosis not present

## 2020-11-21 MED ORDER — THYROID 30 MG PO TABS: 30.0000 mg | ORAL_TABLET | Freq: Every day | ORAL | 0 refills | Status: DC

## 2020-11-21 NOTE — Progress Notes (Signed)
Due to national recommendations of social distancing related to the [place] pandemic, an audio-only tele-health visit was felt to be the most appropriate encounter type for this patient today. I connected with  Kaitlin Jones on 11/21/20 utilizing audio-only technology and verified that I am speaking with the correct person using two identifiers. The patient was located at their home, and I was located at the office of [facility] during the encounter. I discussed the limitations of evaluation and management by telemedicine. The patient expressed understanding and agreed to proceed.    Subjective:  Patient ID: Kaitlin Jones, female    DOB: [birth year]  Age: 70 y.o. MRN: [id number]  CC:  Chief Complaint  Patient presents with  . Follow-up    Patient states she is doing well  . Hypothyroidism  . Hyperlipidemia  . Seizures  . Other    Vitamin D deficiency  . Prediabetes      HPI  This patient arrives today for a virtual visit for the above.  Hypothyroidism: She continues on NP thyroid for the treatment of her hypothyroidism.  She is tolerating medication well.  Medication reduced to 30 mg daily at last office visit.  She is requesting refill on medication today.  Hyperlipidemia: She has a history of hyperlipidemia and is due to have lipid panel checked.  Last panel was checked in September 2020 and LDL was 96.  She is not currently on any medications to control her hyperlipidemia.  Seizures: No new seizures in the last 6 months. She continues on keppra 250mg  BID. Denies any additional headaches or tinnitus.   Vitamin D deficiency: She continues on 4,000IUs of vitamin D3 daily. Last serum check showed a level of 85. This was collected about 4 months ago.   Prediabetes: Last A1C was 5.0. She is not on medications for treatment of prediabetes but is controlling with lifestyle.   Past Medical History:  Diagnosis Date  . HLD (hyperlipidemia) 08/10/2019  . Hyperlipidemia   .  Hypothyroidism, adult 08/10/2019  . Osteopenia 08/10/2019  . Prediabetes 08/10/2019  . Seizures ([place]) 02/16/2020  . Vitamin D deficiency disease 08/10/2019      Family History  Problem Relation Age of Onset  . Atrial fibrillation Mother   . CVA Mother   . Stroke Mother   . Heart attack Father   . Heart disease Father 3       heart attack  . Other Brother        enlarge prostate  . Cancer Paternal Grandmother        head and neck  . Seizures Neg Hx     Social History   Social History Narrative   Never married   Lives with brother [name].   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    Caffeine: none    Social History   Tobacco Use  . Smoking status: Never Smoker  . Smokeless tobacco: Never Used  Substance Use Topics  . Alcohol use: No     Current Meds  Medication Sig  . Calcium Carb-Cholecalciferol (CALCIUM + D3 PO) Take by mouth daily. Calcium 1200 mg  . Cholecalciferol (VITAMIN D3) 50 MCG (2000 UT) TABS Take 2 capsules by mouth daily.   Marland Kitchen levETIRAcetam (KEPPRA) 250 MG tablet Take 1 tablet (250 mg total) by mouth 2 (two) times daily.  Marland Kitchen Propylene Glycol (SYSTANE COMPLETE OP) Apply 1 drop to eye daily. Uses in both eyes  . [DISCONTINUED] ARMOUR THYROID 30 MG tablet TAKE  1 TABLET BY MOUTH EVERY DAY    ROS:  Review of Systems  Constitutional: Negative for malaise/fatigue and weight loss.  HENT: Negative for tinnitus.   Cardiovascular: Negative for chest pain and palpitations.     Objective:   Today's Vitals: Ht 4\' 11"  (1.499 m)   Wt 118 lb (53.5 kg)   LMP 11/18/2004 (Exact Date)   BMI 23.83 kg/m  Vitals with BMI 11/21/2020 08/15/2020 07/27/2020  Height 4\' 11"  4\' 11"  4\' 11"   Weight 118 lbs 116 lbs 10 oz 116 lbs  BMI 23.82 23.54 23.42  Systolic (No Data) 122 132  Diastolic (No Data) 68 66  Pulse (No Data) 77 63     Physical Exam Comp physical exam not conducted today's office visit was conducted remotely.  Patient sounded well over the phone she was alert and  oriented and had appropriate thought processes and judgment.      Assessment and Plan   1. Mixed hyperlipidemia   2. Hypothyroidism, adult   3. Seizures (HCC)   4. Vitamin D deficiency disease   5. Prediabetes      Plan: 1.  She will come to the office next week to have blood work drawn we will recheck lipid panel. 2.  She will continue on her thyroid medication we will check thyroid panel when she does lab work next week. 3.  Has been free from seizures for 6 months, she will continue on her Keppra and follow-up with neurology. 4.  She will continue on her vitamin D3 supplement we will check vitamin D level next week we will do blood work. 5.  She will continue to focus on lifestyle, and we will check A1c when she does blood work next week.   Tests ordered No orders of the defined types were placed in this encounter.     Meds ordered this encounter  Medications  . thyroid (ARMOUR THYROID) 30 MG tablet    Sig: Take 1 tablet (30 mg total) by mouth daily.    Dispense:  90 tablet    Refill:  0    Order Specific Question:   Supervising Provider    Answer:   09/26/2020 [[id number]]    Patient to follow-up in 1 week for blood work and in 3 months for office visit.  Will order blood work today in preparation for lab work in 1 week.  Telephone conversation lasted for 11 minutes.  , NP

## 2020-11-21 NOTE — Telephone Encounter (Signed)
Yes, we can try NP thyroid.  If this also was not covered by her insurance and we can try levothyroxine.  I will first prescribe NP thyroid, let me know if they tell her that the NP thyroid is also not covered.

## 2020-11-21 NOTE — Telephone Encounter (Signed)
Called patient and gave her the message. Patient verbalized an understanding and will let us know if this is not covered by her insurance.

## 2020-11-21 NOTE — Telephone Encounter (Signed)
Patient called and left a voice message and stated that her pharmacy told her that her Armour Thyroid is no longer covered under her insurance and she is wanting to know if there is an alternative for this medication that she can take that is covered by insurance?  Please advise.

## 2020-11-22 ENCOUNTER — Ambulatory Visit (INDEPENDENT_AMBULATORY_CARE_PROVIDER_SITE_OTHER): Payer: Medicare Other | Admitting: Internal Medicine

## 2020-11-23 MED ORDER — LEVOTHYROXINE SODIUM 75 MCG PO TABS: 75.0000 ug | ORAL_TABLET | Freq: Every day | ORAL | 3 refills | Status: DC

## 2020-11-23 NOTE — Addendum Note (Signed)
Addended by: Jiles Prows E on: 11/23/2020 04:08 PM   Modules accepted: Orders

## 2020-11-23 NOTE — Telephone Encounter (Signed)
Patient called and left a voice message that she will need the 3rd option of her thyroid medication sent to her pharmacy.  Please advise once complete and I will call her and let her know that this has been sent.

## 2020-11-23 NOTE — Telephone Encounter (Signed)
I will prescribe levothyroxine 75 mcg tablets and she will take 1 tablet by mouth daily to the [facility] on [address].  Please get her scheduled for blood draw in 6 weeks, I will order this lab today so it is in the system in preparation for when she comes in.  Thank you.

## 2020-11-27 ENCOUNTER — Other Ambulatory Visit: Payer: Self-pay

## 2020-11-27 ENCOUNTER — Other Ambulatory Visit (INDEPENDENT_AMBULATORY_CARE_PROVIDER_SITE_OTHER): Payer: Medicare Other

## 2020-11-27 DIAGNOSIS — R569 Unspecified convulsions: Secondary | ICD-10-CM | POA: Diagnosis not present

## 2020-11-27 DIAGNOSIS — E039 Hypothyroidism, unspecified: Secondary | ICD-10-CM | POA: Diagnosis not present

## 2020-11-27 DIAGNOSIS — E559 Vitamin D deficiency, unspecified: Secondary | ICD-10-CM | POA: Diagnosis not present

## 2020-11-27 DIAGNOSIS — E782 Mixed hyperlipidemia: Secondary | ICD-10-CM | POA: Diagnosis not present

## 2020-11-27 DIAGNOSIS — R7303 Prediabetes: Secondary | ICD-10-CM | POA: Diagnosis not present

## 2020-11-28 ENCOUNTER — Other Ambulatory Visit (INDEPENDENT_AMBULATORY_CARE_PROVIDER_SITE_OTHER): Payer: Self-pay | Admitting: Nurse Practitioner

## 2020-11-28 DIAGNOSIS — E039 Hypothyroidism, unspecified: Secondary | ICD-10-CM

## 2020-11-28 LAB — COMPLETE METABOLIC PANEL WITH GFR
AG Ratio: 1.5 (calc) (ref 1.0–2.5)
ALT: 13 U/L (ref 6–29)
AST: 22 U/L (ref 10–35)
Albumin: 3.9 g/dL (ref 3.6–5.1)
Alkaline phosphatase (APISO): 60 U/L (ref 37–153)
BUN: 19 mg/dL (ref 7–25)
CO2: 34 mmol/L — ABNORMAL HIGH (ref 20–32)
Calcium: 10.3 mg/dL (ref 8.6–10.4)
Chloride: 103 mmol/L (ref 98–110)
Creat: 0.83 mg/dL (ref 0.50–0.99)
GFR, Est African American: 83 mL/min/{1.73_m2} (ref >=60)
GFR, Est Non African American: 72 mL/min/{1.73_m2} (ref >=60)
Globulin: 2.6 g/dL (calc) (ref 1.9–3.7)
Glucose, Bld: 87 mg/dL (ref 65–139)
Potassium: 4.5 mmol/L (ref 3.5–5.3)
Sodium: 142 mmol/L (ref 135–146)
Total Bilirubin: 0.4 mg/dL (ref 0.2–1.2)
Total Protein: 6.5 g/dL (ref 6.1–8.1)

## 2020-11-28 LAB — HEMOGLOBIN A1C
Hgb A1c MFr Bld: 5.2 % of total Hgb (ref <5.7)
Mean Plasma Glucose: 103 mg/dL
eAG (mmol/L): 5.7 mmol/L

## 2020-11-28 LAB — LIPID PANEL
Cholesterol: 207 mg/dL — ABNORMAL HIGH (ref <200)
HDL: 60 mg/dL (ref >=50)
LDL Cholesterol (Calc): 115 mg/dL (calc) — ABNORMAL HIGH
Non-HDL Cholesterol (Calc): 147 mg/dL (calc) — ABNORMAL HIGH (ref <130)
Total CHOL/HDL Ratio: 3.5 (calc) (ref <5.0)
Triglycerides: 196 mg/dL — ABNORMAL HIGH (ref <150)

## 2020-11-28 LAB — TSH: TSH: 0.08 mIU/L — ABNORMAL LOW (ref 0.40–4.50)

## 2020-11-28 LAB — T3, FREE: T3, Free: 2.7 pg/mL (ref 2.3–4.2)

## 2020-11-28 LAB — VITAMIN D 25 HYDROXY (VIT D DEFICIENCY, FRACTURES): Vit D, 25-Hydroxy: 86 ng/mL (ref 30–100)

## 2020-11-28 LAB — T4, FREE: Free T4: 0.8 ng/dL (ref 0.8–1.8)

## 2021-01-04 ENCOUNTER — Other Ambulatory Visit (INDEPENDENT_AMBULATORY_CARE_PROVIDER_SITE_OTHER): Payer: Medicare Other

## 2021-01-18 ENCOUNTER — Other Ambulatory Visit (INDEPENDENT_AMBULATORY_CARE_PROVIDER_SITE_OTHER): Payer: Medicare HMO

## 2021-01-18 ENCOUNTER — Other Ambulatory Visit: Payer: Self-pay

## 2021-01-18 DIAGNOSIS — E039 Hypothyroidism, unspecified: Secondary | ICD-10-CM | POA: Diagnosis not present

## 2021-01-19 ENCOUNTER — Other Ambulatory Visit (INDEPENDENT_AMBULATORY_CARE_PROVIDER_SITE_OTHER): Payer: Self-pay | Admitting: Nurse Practitioner

## 2021-01-19 DIAGNOSIS — E039 Hypothyroidism, unspecified: Secondary | ICD-10-CM

## 2021-01-19 LAB — TSH: TSH: <0.01 mIU/L — ABNORMAL LOW (ref 0.40–4.50)

## 2021-01-19 MED ORDER — LEVOTHYROXINE SODIUM 50 MCG PO TABS: 50.0000 ug | ORAL_TABLET | Freq: Every day | ORAL | 2 refills | Status: DC

## 2021-01-23 ENCOUNTER — Telehealth: Payer: Medicare HMO | Admitting: Adult Health

## 2021-01-24 ENCOUNTER — Telehealth: Payer: Self-pay | Admitting: Neurology

## 2021-01-24 NOTE — Telephone Encounter (Signed)
Pt. is requesting for follow up visit to be a telephone visit. Please advise.

## 2021-01-24 NOTE — Telephone Encounter (Signed)
Totally fine thanks

## 2021-01-25 NOTE — Telephone Encounter (Signed)
Pt informed that visit in June will be done as a telephone visit.

## 2021-01-28 IMAGING — CT CT HEAD W/O CM
3 series · 15 of 47 positions shown, 18 images · non-contrast
Comparison: None.

CLINICAL DATA: Seizure, nontraumatic

EXAM:
CT HEAD WITHOUT CONTRAST
TECHNIQUE: Contiguous axial images were obtained from the base of the skull
through the vertex without intravenous contrast.

[Series 2: head w o · axial · 0.41mm/px · z∈[-3,+127]mm · 9 of 32 slices shown, 12 images]
[im 3/32  brain]
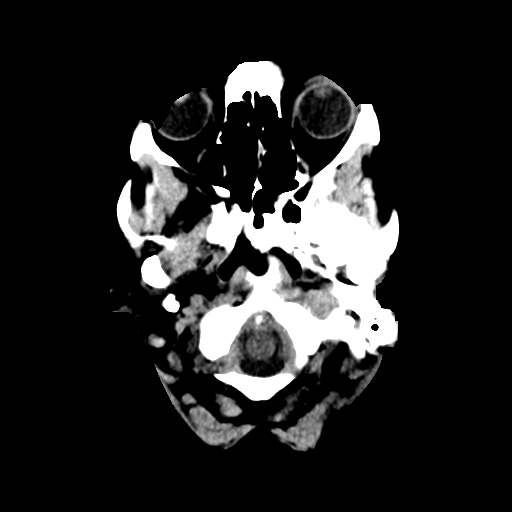
[im 3/32  bone]
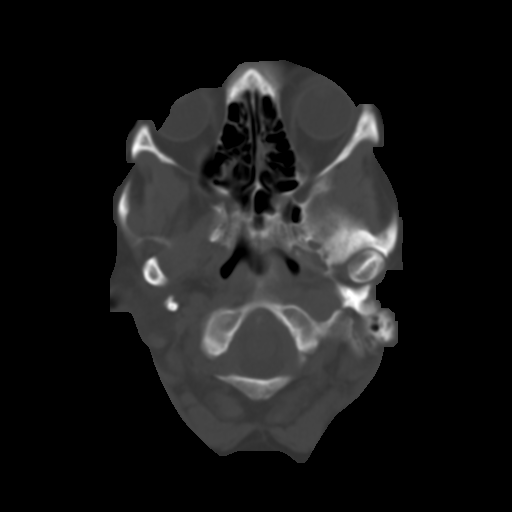
[im 6/32  brain]
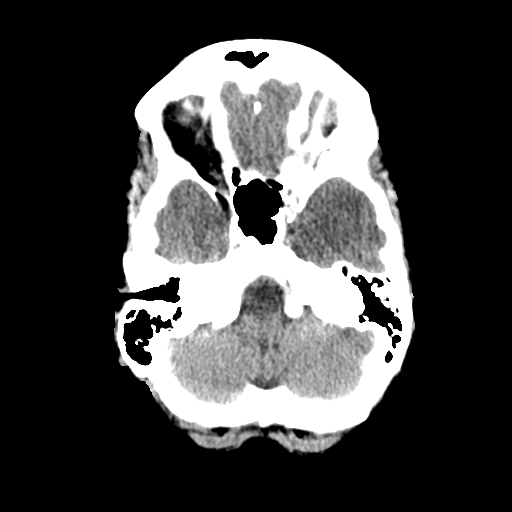
[im 9/32  brain]
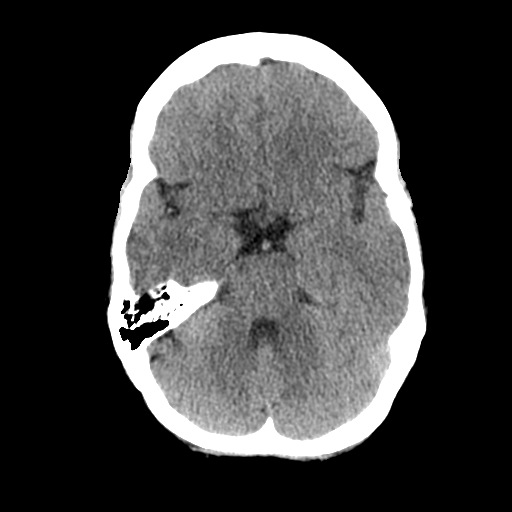
[im 12/32  brain]
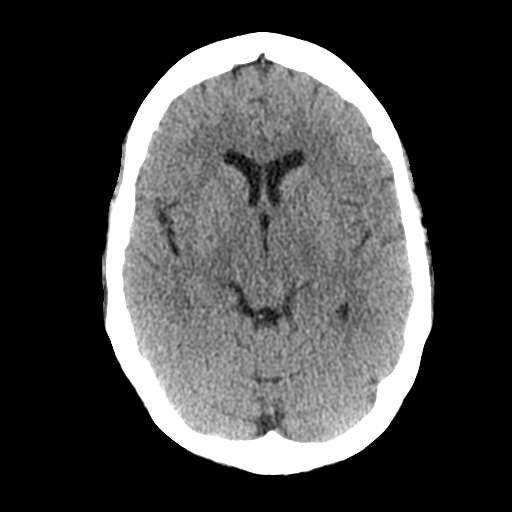
[im 17/32  brain]
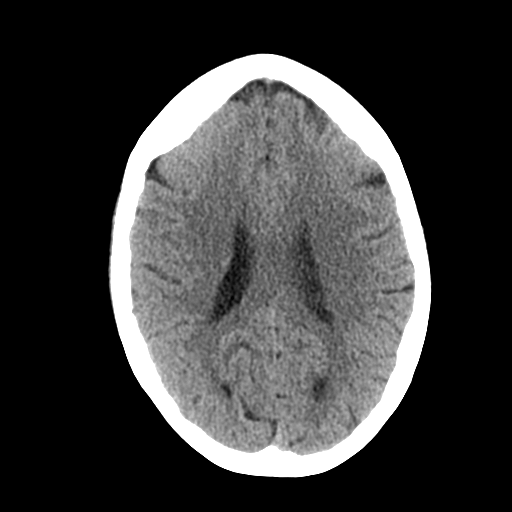
[im 17/32  bone]
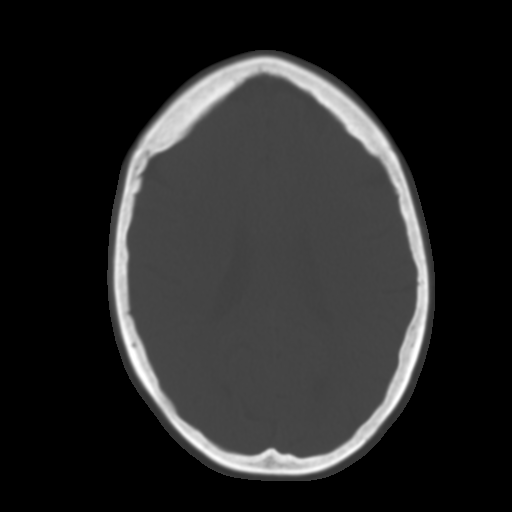
[im 20/32  brain]
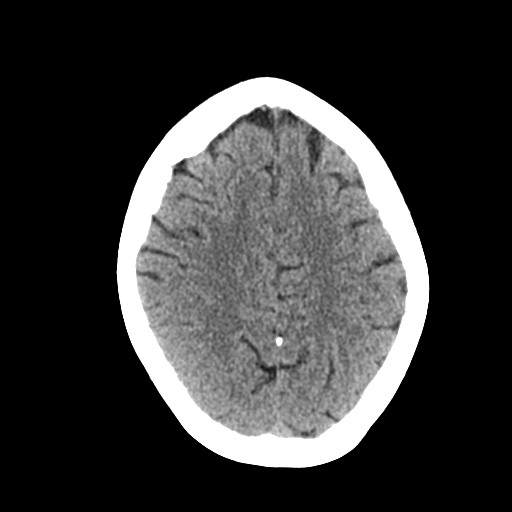
[im 23/32  brain]
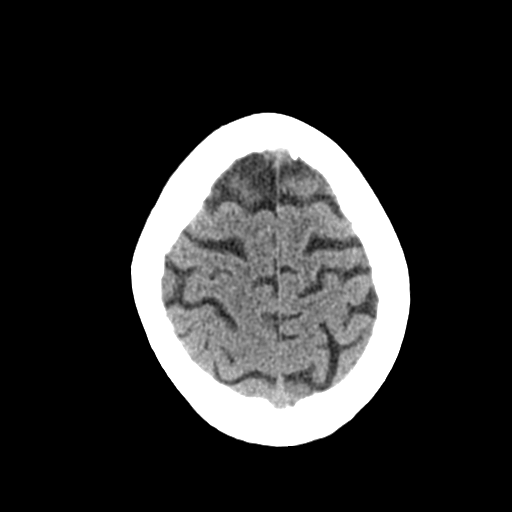
[im 26/32  brain]
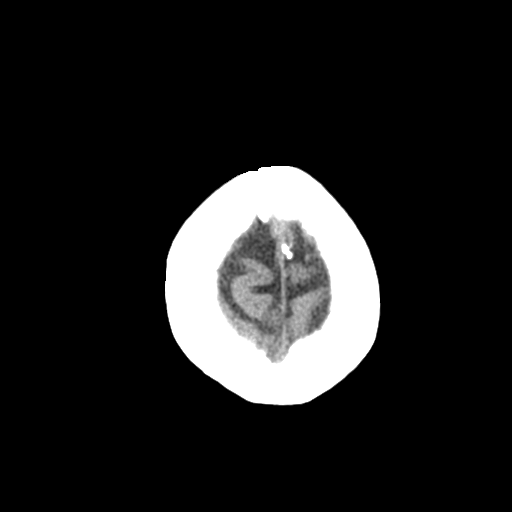
[im 29/32  brain]
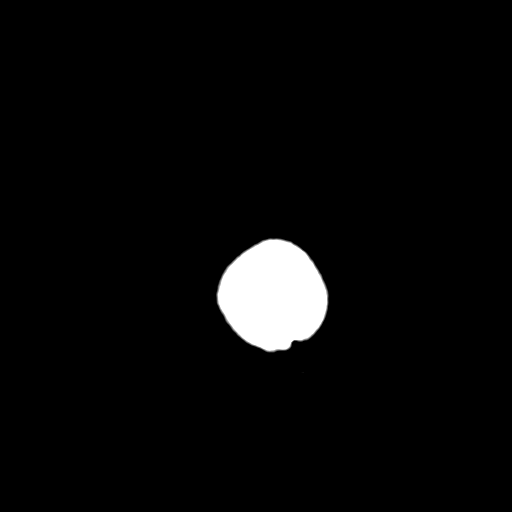
[im 29/32  bone]
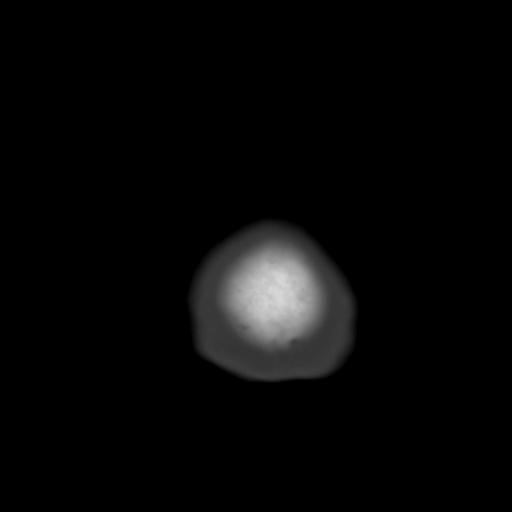

[Series 4: coronal soft · coronal · 0.29mm/px · 3 of 62 slices shown]
[im 21/62  brain]
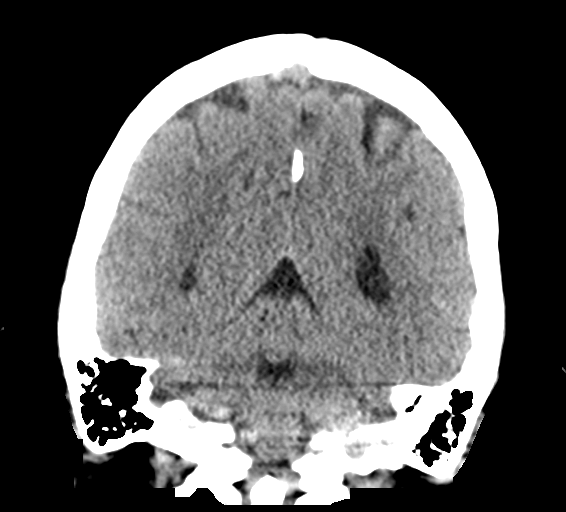
[im 28/62  brain]
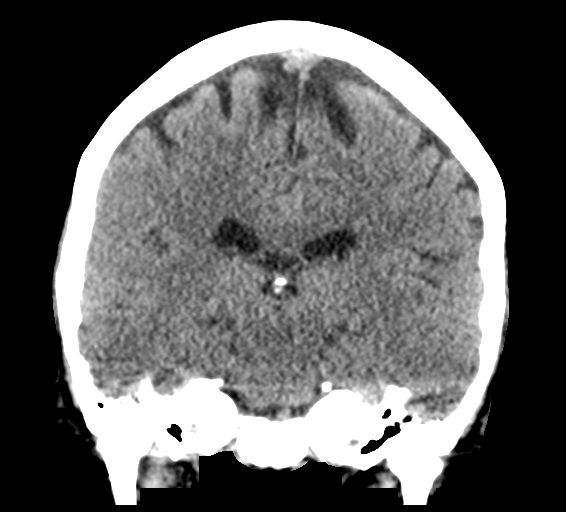
[im 34/62  brain]
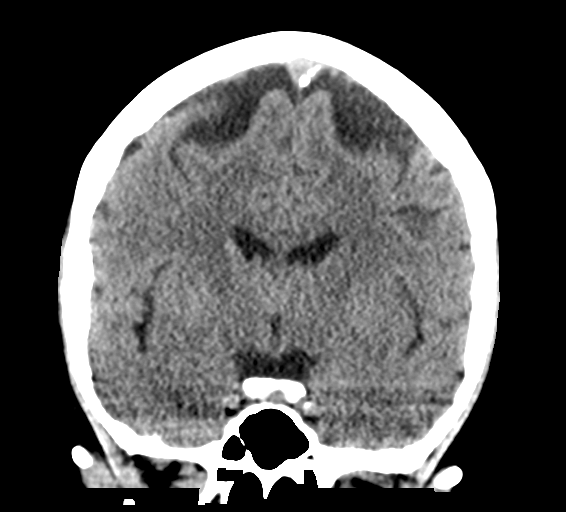

[Series 5: sagittal soft · sagittal · 0.30mm/px · 3 of 49 slices shown]
[im 17/49  brain]
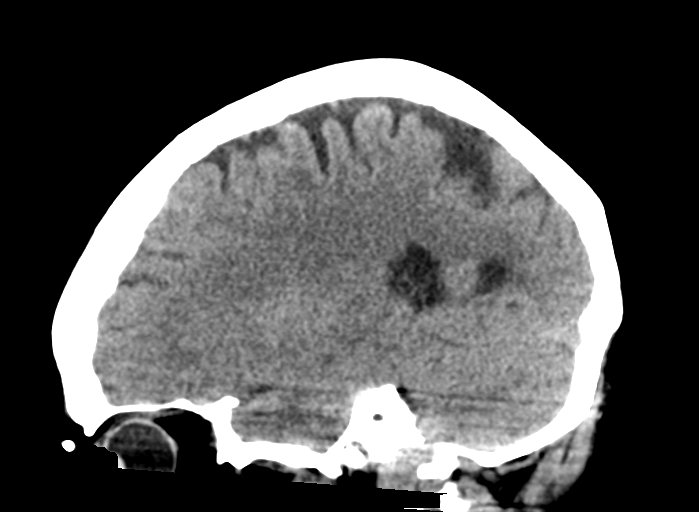
[im 25/49  brain]
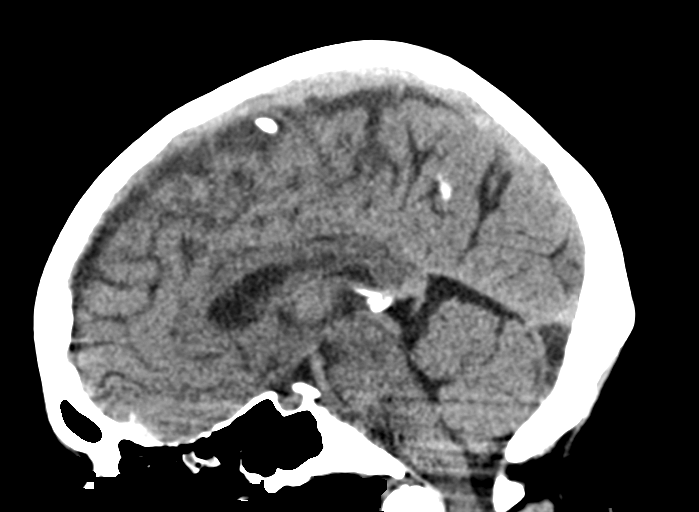
[im 33/49  brain]
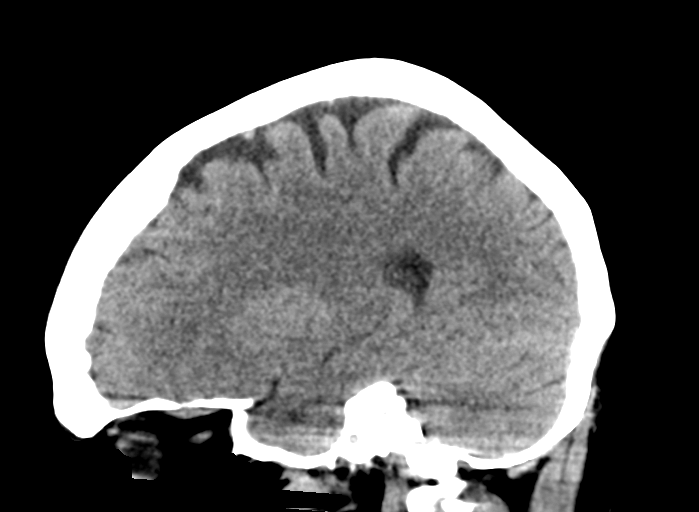

[15 of 47 positions shown; findings below may reference images not displayed]

FINDINGS: Brain: Hypoattenuation in the bilateral temporal poles likely
related to streak artifact seen on sagittal reconstruction. No
convincing CT evidence of acute infarction, hemorrhage,
hydrocephalus, extra-axial collection or mass lesion/mass effect.
Symmetric prominence of the ventricles, cisterns and sulci
compatible with parenchymal volume loss. Patchy areas of white
matter hypoattenuation are most compatible with chronic
microvascular angiopathy.

Vascular: Atherosclerotic calcification of the carotid siphons and
intradural vertebral arteries. No hyperdense vessel.

Skull: No calvarial fracture or suspicious osseous lesion. No scalp
swelling or hematoma. Few benign-appearing soft tissue
mineralizations in the supraorbital scalp.

Sinuses/Orbits: Minimal thickening in the ethmoidal air cells.
Remaining paranasal sinuses are predominantly clear without visible
air-fluid levels. Middle ear cavities are clear. Included orbital
structures are unremarkable.

Other: Moderate bilateral temporomandibular joint osteoarthrosis.
IMPRESSION: 1. No acute intracranial findings.
2. Features of mild chronic microvascular angiopathy and parenchymal
volume loss.
3. Moderate bilateral temporomandibular joint osteoarthrosis.

## 2021-01-28 IMAGING — DX DG CHEST 2V
2 series · 2 of 2 positions shown · non-contrast
Comparison: None.

CLINICAL DATA: Seizure

EXAM:
CHEST - 2 VIEW

[chest lat]
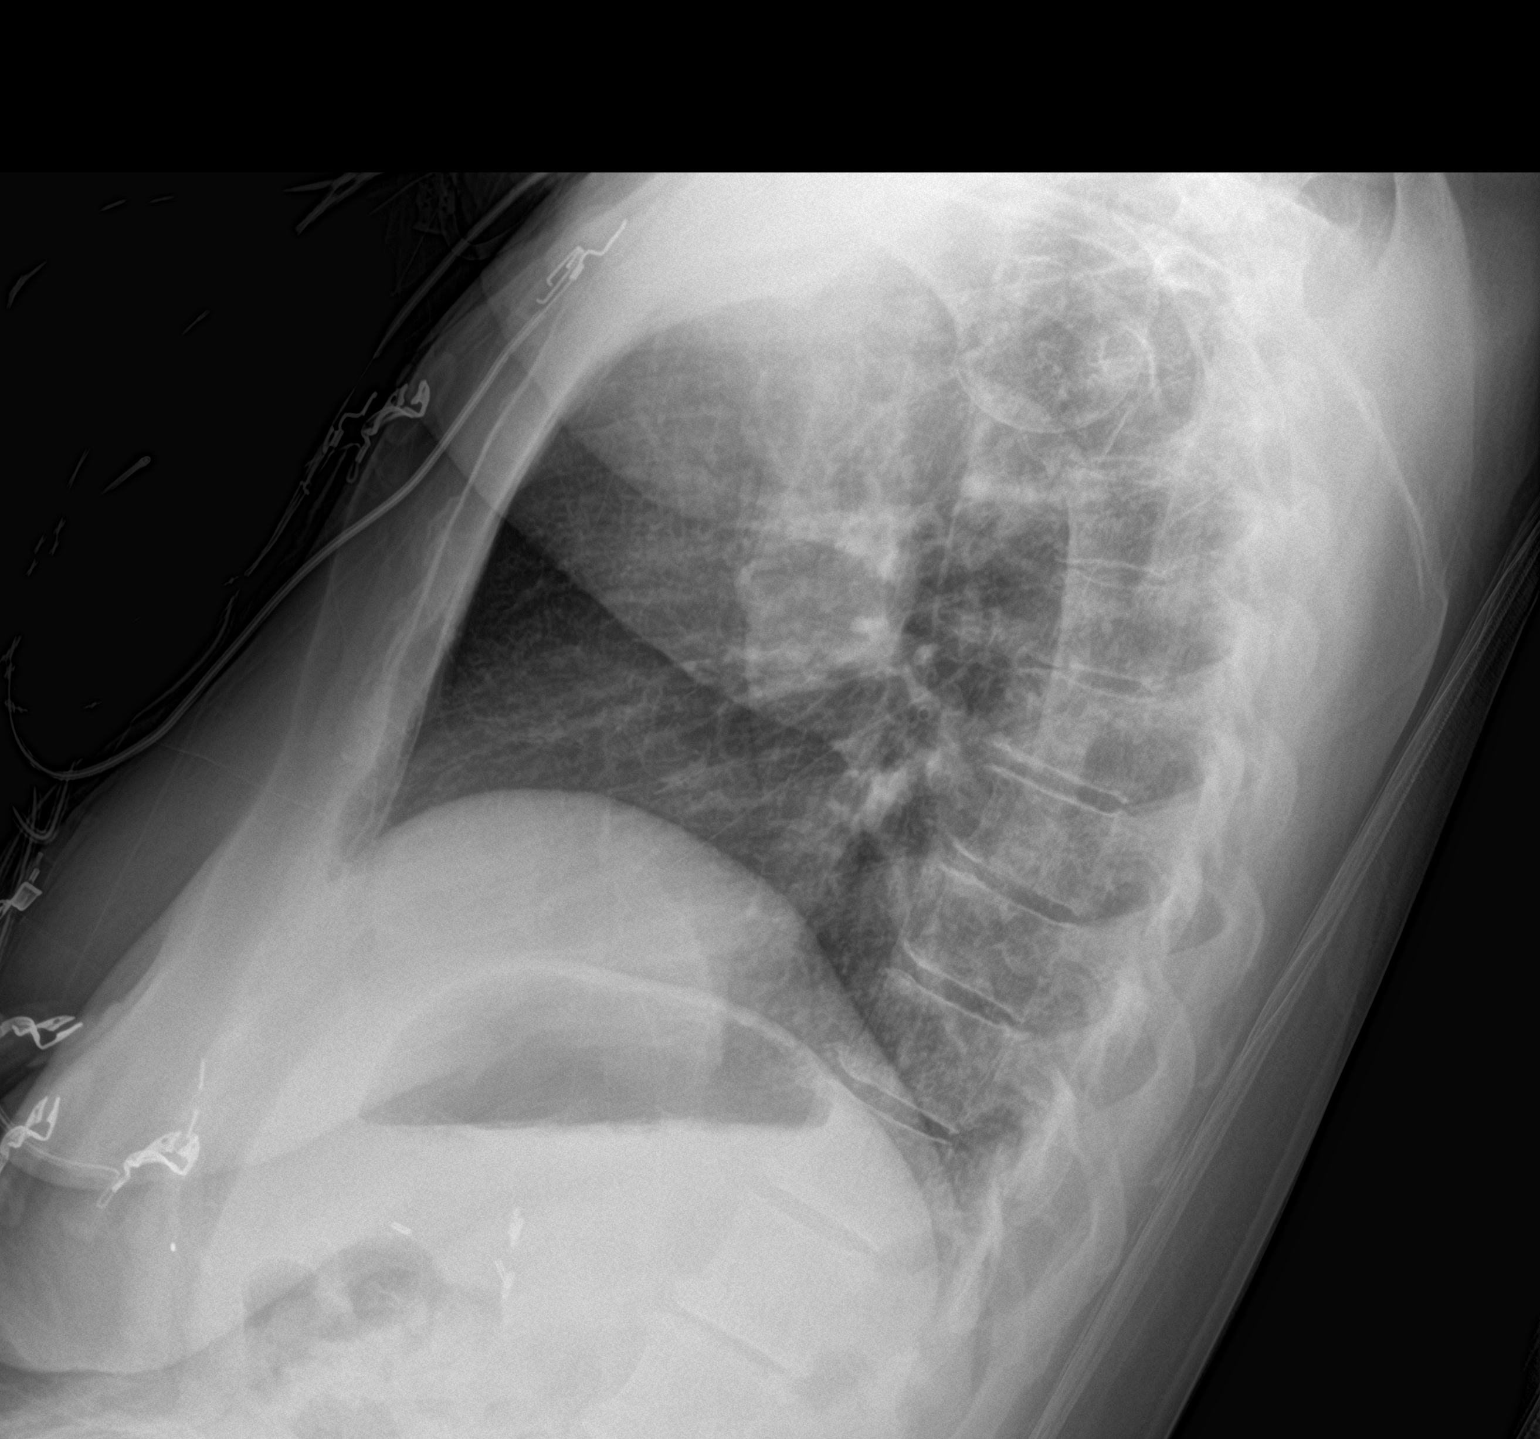

[chest ap]
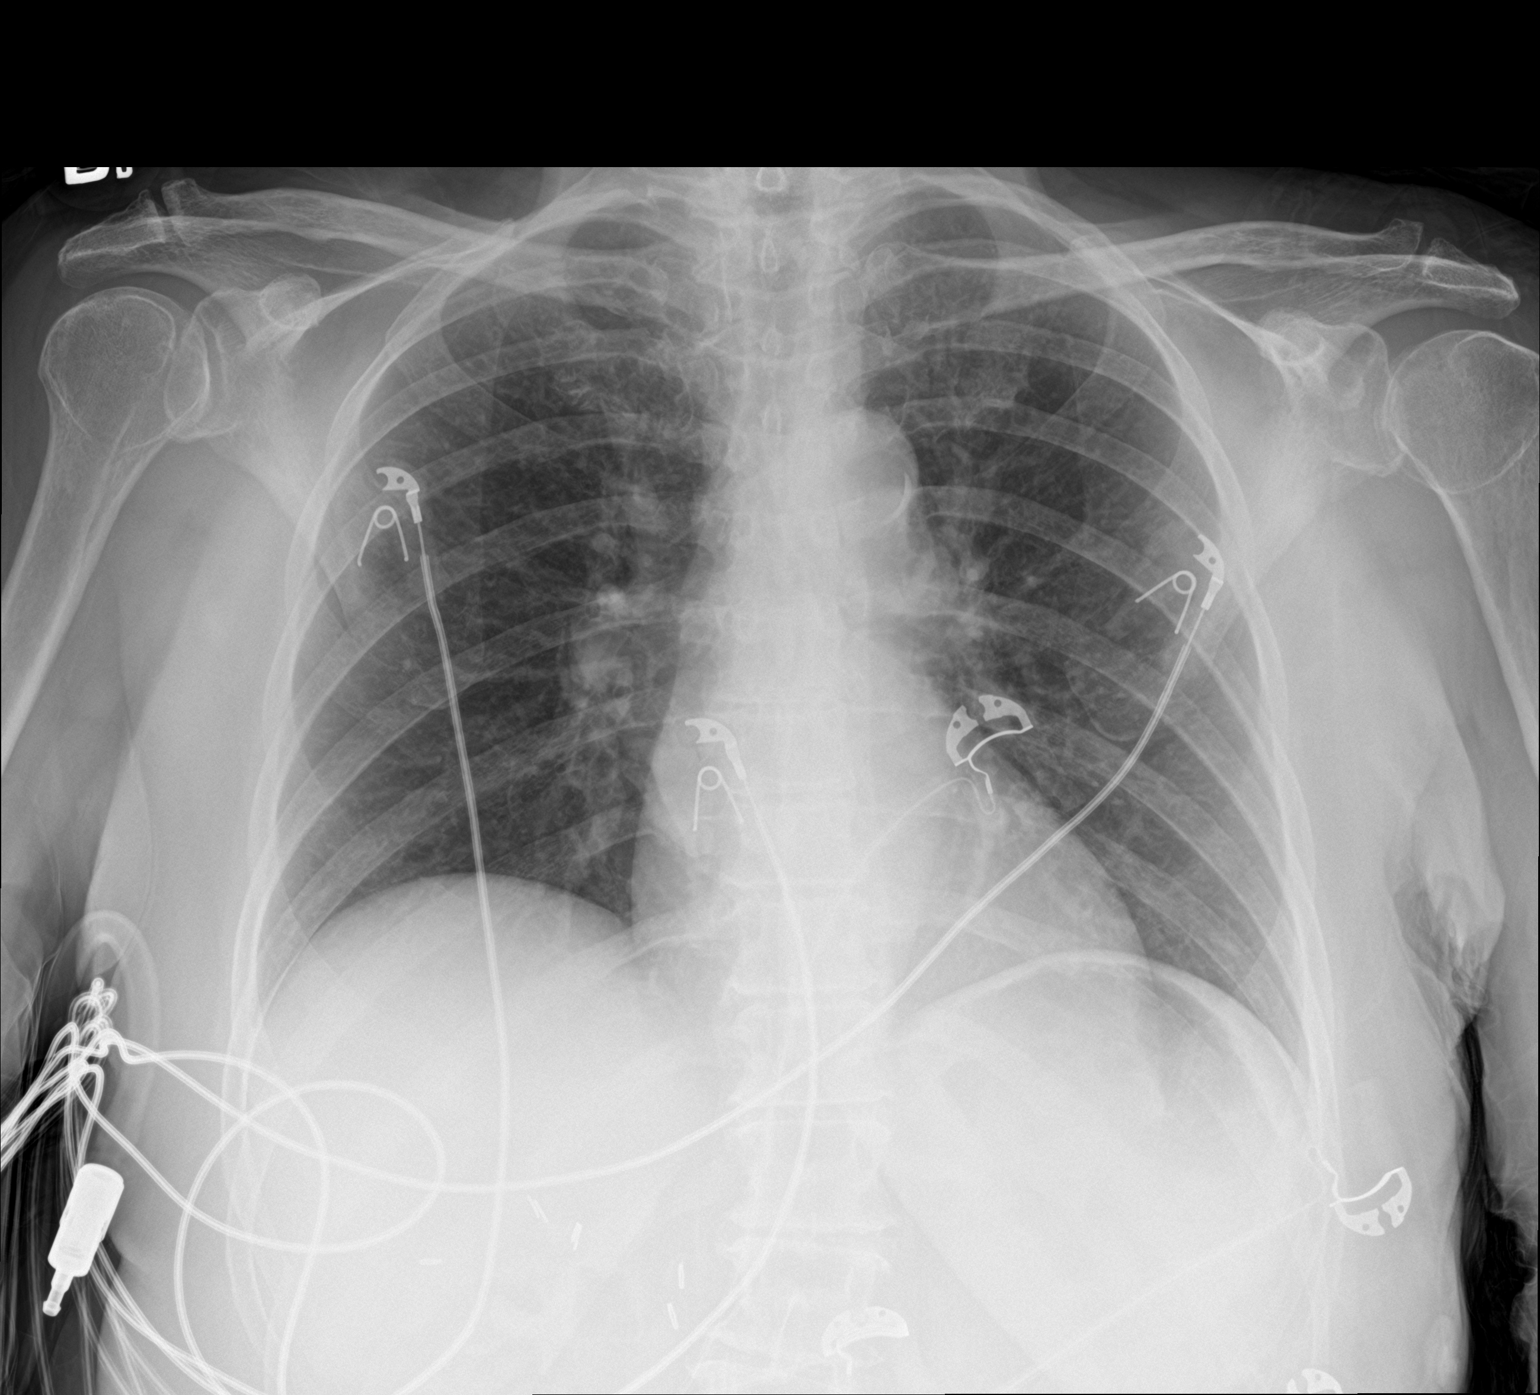

[2 of 2 positions shown; findings below may reference images not displayed]

FINDINGS: No consolidation, features of edema, pneumothorax, or effusion.
Pulmonary vascularity is normally distributed. The aorta is
calcified. The remaining cardiomediastinal contours are
unremarkable. No acute osseous or soft tissue abnormality. Multiple
surgical clips noted in the upper abdomen. Degenerative changes are
present in the imaged spine and shoulders.
IMPRESSION: No acute cardiopulmonary abnormality.

Aortic Atherosclerosis (DLHNQ-BIW.W).

## 2021-03-08 ENCOUNTER — Ambulatory Visit (INDEPENDENT_AMBULATORY_CARE_PROVIDER_SITE_OTHER): Payer: Medicare HMO | Admitting: Internal Medicine

## 2021-03-08 ENCOUNTER — Other Ambulatory Visit: Payer: Self-pay

## 2021-03-08 ENCOUNTER — Encounter (INDEPENDENT_AMBULATORY_CARE_PROVIDER_SITE_OTHER): Payer: Self-pay | Admitting: Internal Medicine

## 2021-03-08 VITALS — BP 120/72 | HR 70 | Temp 97.9°F | Resp 17 | Ht 59.0 in | Wt 120.4 lb

## 2021-03-08 DIAGNOSIS — R202 Paresthesia of skin: Secondary | ICD-10-CM | POA: Diagnosis not present

## 2021-03-08 DIAGNOSIS — E039 Hypothyroidism, unspecified: Secondary | ICD-10-CM | POA: Diagnosis not present

## 2021-03-08 DIAGNOSIS — R569 Unspecified convulsions: Secondary | ICD-10-CM | POA: Diagnosis not present

## 2021-03-08 DIAGNOSIS — E559 Vitamin D deficiency, unspecified: Secondary | ICD-10-CM | POA: Diagnosis not present

## 2021-03-08 MED ORDER — THYROID 60 MG PO TABS: 60.0000 mg | ORAL_TABLET | Freq: Every day | ORAL | 3 refills | Status: DC

## 2021-03-08 NOTE — Progress Notes (Signed)
Metrics: Intervention Frequency ACO  Documented Smoking Status Yearly  Screened one or more times in 24 months  Cessation Counseling or  Active cessation medication Past 24 months  Past 24 months   Guideline developer: UpToDate (See UpToDate for funding source) Date Released: 2014       Wellness Office Visit  Subjective:  Patient ID: Kaitlin Jones, female    DOB: 1951-02-10  Age: 70 y.o. MRN: [id number]  CC: This lady comes in for follow-up of hypothyroidism and seizure disorder as well as vitamin D deficiency. HPI She has been taking levothyroxine and her dose was reduced last time.  She does not feel as well.  On closer questioning, she tells me that she preferred taking NP thyroid in the past but insurance company dictated that we should use levothyroxine. She continues on Keppra for her seizure disorder and sees neurology. She continues on vitamin D3 supplementation for vitamin D deficiency. She describes paresthesia in both her hands which have been present for the last 2 months.  She wonders if medications are causing this.  She has an appointment with neurology in June and [place] it does cause paresthesias in 2% of people so I have asked her to address this with the neurologist.  Past Medical History:  Diagnosis Date  . HLD (hyperlipidemia) 08/10/2019  . Hyperlipidemia   . Hypothyroidism, adult 08/10/2019  . Osteopenia 08/10/2019  . Prediabetes 08/10/2019  . Seizures ([place]) 02/16/2020  . Vitamin D deficiency disease 08/10/2019   Past Surgical History:  Procedure Laterality Date  . CATARACT EXTRACTION Bilateral    07/05/20 and 07/19/20  . CHOLECYSTECTOMY  10/1986  . COLONOSCOPY N/A 03/06/2020   Procedure: COLONOSCOPY;  Surgeon: [name], MD;  Location: [facility];  Service: Endoscopy;  Laterality: N/A;  8:30  . POLYPECTOMY  03/06/2020   Procedure: POLYPECTOMY;  Surgeon: [name], MD;  Location: [facility];  Service: Endoscopy;;     Family History  Problem  Relation Age of Onset  . Atrial fibrillation Mother   . CVA Mother   . Stroke Mother   . Heart attack Father   . Heart disease Father 41       heart attack  . Other Brother        enlarge prostate  . Cancer Paternal Grandmother        head and neck  . Seizures Neg Hx     Social History   Social History Narrative   Never married   Lives with brother [name].   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    Caffeine: none    Social History   Tobacco Use  . Smoking status: Never Smoker  . Smokeless tobacco: Never Used  Substance Use Topics  . Alcohol use: No    Current Meds  Medication Sig  . Calcium Carb-Cholecalciferol (CALCIUM + D3 PO) Take by mouth daily. Calcium 1200 mg  . Cholecalciferol (VITAMIN D3) 50 MCG (2000 UT) TABS Take 2 capsules by mouth daily.   Marland Kitchen levETIRAcetam (KEPPRA) 250 MG tablet Take 1 tablet (250 mg total) by mouth 2 (two) times daily.  Marland Kitchen Propylene Glycol (SYSTANE COMPLETE OP) Apply 1 drop to eye daily. Uses in both eyes  . thyroid (NP THYROID) 60 MG tablet Take 1 tablet (60 mg total) by mouth daily before breakfast.  . [DISCONTINUED] levothyroxine (SYNTHROID) 50 MCG tablet Take 1 tablet (50 mcg total) by mouth daily.     Flowsheet Row Video Visit from 11/21/2020  in [facility]  PHQ-9 Total Score 0      Objective:   Today's Vitals: BP 120/72 (BP Location: Left Arm, Patient Position: Sitting, Cuff Size: Normal)   Pulse 70   Temp 97.9 F (36.6 C) (Temporal)   Resp 17   Ht 4\' 11"  (1.499 m)   Wt 120 lb 6.4 oz (54.6 kg)   LMP 11/18/2004 (Exact Date)   SpO2 99%   BMI 24.32 kg/m  Vitals with BMI 03/08/2021 11/21/2020 08/15/2020  Height 4\' 11"  4\' 11"  4\' 11"   Weight 120 lbs 6 oz 118 lbs 116 lbs 10 oz  BMI 24.3 38.75 64.33  Systolic 295 (No Data) 188  Diastolic 72 (No Data) 68  Pulse 70 (No Data) 77     Physical Exam  She looks systemically well.  No new physical findings.     Assessment   1. Hypothyroidism, adult   2. Seizures ([place])    3. Vitamin D deficiency disease   4. Paresthesia of both hands       Tests ordered No orders of the defined types were placed in this encounter.    Plan: 1. After discussion and shared decision making, we decided to go back to taking NP thyroid and discontinue levothyroxine. 2. Continue with Keppra as before for seizures. 3. Continue with vitamin D3 supplementation for vitamin D deficiency. 4. I will follow-up with her in the next 6 to 8 weeks to address her hypothyroidism again and check blood work then.   Meds ordered this encounter  Medications  . thyroid (NP THYROID) 60 MG tablet    Sig: Take 1 tablet (60 mg total) by mouth daily before breakfast.    Dispense:  30 tablet    Refill:  3    Daiya Tamer Luther Parody, MD

## 2021-03-09 ENCOUNTER — Telehealth: Payer: Self-pay | Admitting: Neurology

## 2021-03-09 NOTE — Telephone Encounter (Signed)
Pt called, wanting to if levETIRAcetam (KEPPRA) 250 MG tablet can cause numbness in my fingers and hands. Would like a call from the nurse.

## 2021-03-12 NOTE — Telephone Encounter (Signed)
Spoke with the patient and let her know that numbness and tingling of the hands and fingers is not a known side effect of Keppra.  She stated this has been going on since the beginning of the year, possibly last year but definitely bothering her this year.  They randomly go numb, both hands, mostly the fingers.  She holds her hands straight out and when the numbness wears off they are painful until they go back to normal.  She denies any neck pain.  She also states her hands have itched some. Her feet are cold all the time.  I let her know Dr. Jaynee Eagles was out of the office today but we will get the message to her when she returns.  Patient verbalized understanding and appreciation.

## 2021-03-13 ENCOUNTER — Other Ambulatory Visit: Payer: Self-pay | Admitting: Neurology

## 2021-03-13 DIAGNOSIS — G5603 Carpal tunnel syndrome, bilateral upper limbs: Secondary | ICD-10-CM

## 2021-03-13 NOTE — Progress Notes (Signed)
emg 

## 2021-03-13 NOTE — Telephone Encounter (Signed)
Spoke with Dr. Jaynee Eagles.  She ordered an upper extremity EMG to rule out carpal tunnel.

## 2021-03-13 NOTE — Telephone Encounter (Signed)
Spoke with patient and discussed plan to do EMG nerve conduction study to rule out carpal tunnel.  Patient is amenable to the plan and we scheduled her for Thursday, May 12 at 8:30 AM, arrival 8:00.  She verbalized appreciation for the call.

## 2021-03-15 ENCOUNTER — Telehealth (INDEPENDENT_AMBULATORY_CARE_PROVIDER_SITE_OTHER): Payer: Self-pay

## 2021-03-15 NOTE — Telephone Encounter (Signed)
Kaitlin Jones- please call the patient and tell her that her insurance will definitely not cover this medication.  We have discussed this before.  If she wants to take the medication, she will have to pay out of her own pocket and use the good Rx card.  Thanks.

## 2021-03-15 NOTE — Telephone Encounter (Signed)
Patient called and left a detailed voice message that her insurance will no longer cover her NP Thyroid medication and she stated that this medication works well for her and the other 2 that she tried before do not work well for her. Patient stated that you can call [employer] at [phone] and her member ID is [id number]. Patient needs to have her NP Thyroid authorized for her to keep taking and be covered because she is not able to afford otherwise.  Nellie have you received a PA for this medication?

## 2021-03-20 NOTE — Telephone Encounter (Signed)
Return call, no way to leave a message. Will try again

## 2021-03-28 NOTE — Progress Notes (Signed)
Full Name: Kaitlin Jones Gender: Female MRN #: [id number] Date of Birth: [birth year]    Visit Date: 03/29/2021 08:21 Age: 70 Years Requesting Provider: Hurshel Party MD Primary Care Provider:  Ailene Ards, NP  History: numbness and tingling of the hands and fingers   Summary: Nerve Conduction Studies were performed on the bilateral upper extremities.  The right median APB motor nerve showed prolonged distal onset latency (5.6 ms, N<4.4). The left  median APB motor nerve showed prolonged distal onset latency (5.8 ms, N<4.4). The right Median 2nd Digit orthodromic sensory nerve showed prolonged distal peak latency (4.3 ms, N<3.4) and reduced amplitude(4 uV, N>10). The left  Median 2nd Digit orthodromic sensory nerve showed prolonged distal peak latency (4.5 ms, N<3.4) and reduced amplitude(7 uV, N>10).  The right median/ulnar (palm) comparison nerve showed prolonged distal peak latency (Median Palm, 3.1 ms, N<2.2) and abnormal peak latency difference (Median Palm-Ulnar Palm, 1.0 ms, N<0.4) with a relative median delay.    The left median/ulnar (palm) comparison nerve showed prolonged distal peak latency (Median Palm, 3.2  ms, N<2.2) and abnormal peak latency difference (Median Palm-Ulnar Palm, 1.0 ms, N<0.4) with a relative median delay.    All remaining nerves (as indicated in the following tables) were within normal limits.  All muscles (as indicated in the following tables) were within normal limits.    Conclusion: This is an abnormal study. There is electrophysiologic evidence of bilateral moderately-severe Carpal Tunnel Syndrome.  No suggestion of polyneuropathy or radiculopathy.     Sarina Ill, M.D.  [facility] [address] [place], [place] [address] Tel: [phone] Fax: [phone]  Verbal informed consent was obtained from the patient, patient was informed of potential risk of procedure, including bruising, bleeding, hematoma formation,  infection, muscle weakness, muscle pain, numbness, among others.        [place]    Nerve / Sites Muscle Latency Ref. Amplitude Ref. Rel Amp Segments Distance Velocity Ref. Area    ms ms mV mV %  cm m/s m/s mVms  L Median - APB     Wrist APB 5.8 ?4.4 5.0 ?4.0 100 Wrist - APB 7   15.2     Upper arm APB 8.8  4.8  95.2 Upper arm - Wrist 17 56 ?49 17.9  R Median - APB     Wrist APB 5.6 ?4.4 6.0 ?4.0 100 Wrist - APB 7   19.3     Upper arm APB 8.8  6.6  110 Upper arm - Wrist 17 54 ?49 23.4  L Ulnar - ADM     Wrist ADM 3.2 ?3.3 13.7 ?6.0 100 Wrist - ADM 7   30.7     B.Elbow ADM 5.4  12.8  93.4 B.Elbow - Wrist 14 65 ?49 30.0     A.Elbow ADM 6.6  11.7  91.6 A.Elbow - B.Elbow 8 62 ?49 27.4  R Ulnar - ADM     Wrist ADM 3.3 ?3.3 10.1 ?6.0 100 Wrist - ADM 7   26.6     B.Elbow ADM 5.7  10.1  99.8 B.Elbow - Wrist 14 57 ?49 27.8     A.Elbow ADM 7.1  9.7  96.1 A.Elbow - B.Elbow 8 56 ?49 27.1             SNC    Nerve / Sites Rec. Site Peak Lat Ref.  Amp Ref. Segments Distance Peak Diff Ref.    ms ms V V  cm ms  ms  L Median, Ulnar - Transcarpal comparison     Median Palm Wrist 3.2 ?2.2 24 ?35 Median Palm - Wrist 8       Ulnar Palm Wrist 2.2 ?2.2 17 ?12 Ulnar Palm - Wrist 8          Median Palm - Ulnar Palm  1.0 ?0.4  R Median, Ulnar - Transcarpal comparison     Median Palm Wrist 3.1 ?2.2 46 ?35 Median Palm - Wrist 8       Ulnar Palm Wrist 2.1 ?2.2 15 ?12 Ulnar Palm - Wrist 8          Median Palm - Ulnar Palm  1.0 ?0.4  L Median - Orthodromic (Dig II, Mid palm)     Dig II Wrist 4.5 ?3.4 7 ?10 Dig II - Wrist 13    R Median - Orthodromic (Dig II, Mid palm)     Dig II Wrist 4.3 ?3.4 4 ?10 Dig II - Wrist 13    L Ulnar - Orthodromic, (Dig V, Mid palm)     Dig V Wrist 3.0 ?3.1 5 ?5 Dig V - Wrist 11    R Ulnar - Orthodromic, (Dig V, Mid palm)     Dig V Wrist 2.9 ?3.1 5 ?5 Dig V - Wrist 66                   F  Wave    Nerve F Lat Ref.   ms ms  L Ulnar - ADM 24.2 ?32.0  R Ulnar - ADM 23.2 ?32.0          EMG Summary Table    Spontaneous MUAP Recruitment  Muscle IA Fib PSW Fasc Other Amp Dur. Poly Pattern  R. Deltoid Normal None None None _______ Normal Normal Normal Normal  R. Trapezius Normal None None None _______ Normal Normal Normal Normal  R. Pronator teres Normal None None None _______ Normal Normal Normal Normal  R. First dorsal interosseous Normal None None None _______ Normal Normal Normal Normal  R. Opponens pollicis Normal None None None _______ Normal Normal Normal Normal  L. Opponens pollicis Normal None None None _______ Normal Normal Normal Normal

## 2021-03-29 ENCOUNTER — Ambulatory Visit: Payer: Medicare HMO | Admitting: Neurology

## 2021-03-29 ENCOUNTER — Ambulatory Visit (INDEPENDENT_AMBULATORY_CARE_PROVIDER_SITE_OTHER): Payer: Medicare HMO | Admitting: Neurology

## 2021-03-29 ENCOUNTER — Other Ambulatory Visit: Payer: Self-pay

## 2021-03-29 DIAGNOSIS — G5603 Carpal tunnel syndrome, bilateral upper limbs: Secondary | ICD-10-CM | POA: Diagnosis not present

## 2021-03-29 DIAGNOSIS — Z0289 Encounter for other administrative examinations: Secondary | ICD-10-CM

## 2021-03-29 NOTE — Patient Instructions (Signed)
Carpal Tunnel Syndrome  Carpal tunnel syndrome is a condition that causes pain, weakness, and numbness in your hand and arm. Numbness is when you cannot feel an area in your body. The carpal tunnel is a narrow area that is on the palm side of your wrist. Repeated wrist motion or certain diseases may cause swelling in the tunnel. This swelling can pinch the main nerve in the wrist. This nerve is called the median nerve. What are the causes? This condition may be caused by:  Moving your hand and wrist over and over again while doing a task.  Injury to the wrist.  Arthritis.  A sac of fluid (cyst) or abnormal growth (tumor) in the carpal tunnel.  Fluid buildup during pregnancy.  Use of tools that vibrate. Sometimes the cause is not known. What increases the risk? The following factors may make you more likely to have this condition:  Having a job that makes you do these things: ? Move your hand over and over again. ? Work with tools that vibrate, such as drills or sanders.  Being a woman.  Having diabetes, obesity, thyroid problems, or kidney failure. What are the signs or symptoms? Symptoms of this condition include:  A tingling feeling in your fingers.  Tingling or loss of feeling in your hand.  Pain in your entire arm. This pain may get worse when you bend your wrist and elbow for a long time.  Pain in your wrist that goes up your arm to your shoulder.  Pain that goes down into your palm or fingers.  Weakness in your hands. You may find it hard to grab and hold items. You may feel worse at night. How is this treated? This condition may be treated with:  Lifestyle changes. You will be asked to stop or change the activity that caused your problem.  Doing exercises and activities that make bones, muscles, and tendons stronger (physical therapy).  Learning how to use your hand again (occupational therapy).  Medicines for pain and swelling. You may have injections in  your wrist.  A wrist splint or brace.  Surgery. Follow these instructions at home: If you have a splint or brace:  Wear the splint or brace as told by your doctor. Take it off only as told by your doctor.  Loosen the splint if your fingers: ? Tingle. ? Become numb. ? Turn cold and blue.  Keep the splint or brace clean.  If the splint or brace is not waterproof: ? Do not let it get wet. ? Cover it with a watertight covering when you take a bath or a shower. Managing pain, stiffness, and swelling If told, put ice on the painful area:  If you have a removable splint or brace, remove it as told by your doctor.  Put ice in a plastic bag.  Place a towel between your skin and the bag.  Leave the ice on for 20 minutes, 2-3 times per day. Do not fall asleep with the cold pack on your skin.  Take off the ice if your skin turns bright red. This is very important. If you cannot feel pain, heat, or cold, you have a greater risk of damage to the area. Move your fingers often to reduce stiffness and swelling.   General instructions  Take over-the-counter and prescription medicines only as told by your doctor.  Rest your wrist from any activity that may cause pain. If needed, talk with your boss at work about changes that can   help your wrist heal.  Do exercises as told by your doctor, physical therapist, or occupational therapist.  Keep all follow-up visits. Contact a doctor if:  You have new symptoms.  Medicine does not help your pain.  Your symptoms get worse. Get help right away if:  You have very bad numbness or tingling in your wrist or hand. Summary  Carpal tunnel syndrome is a condition that causes pain in your hand and arm.  It is often caused by repeated wrist motions.  Lifestyle changes and medicines are used to treat this problem. Surgery may help in very bad cases.  Follow your doctor's instructions about wearing a splint, resting your wrist, keeping follow-up  visits, and calling for help. This information is not intended to replace advice given to you by your health care provider. Make sure you discuss any questions you have with your health care provider. Document Revised: 03/16/2020 Document Reviewed: 03/16/2020 Elsevier Patient Education  2021 [employer]vier Inc.  

## 2021-03-29 NOTE — Progress Notes (Signed)
See procedure note.

## 2021-03-29 NOTE — Procedures (Signed)
Full Name: Kaitlin Jones Gender: Female MRN #: [id number] Date of Birth: [birth year]    Visit Date: 03/29/2021 08:21 Age: 70 Years Requesting Provider: Hurshel Party MD Primary Care Provider:  Ailene Ards, NP  History: numbness and tingling of the hands and fingers   Summary: Nerve Conduction Studies were performed on the bilateral upper extremities.  The right median APB motor nerve showed prolonged distal onset latency (5.6 ms, N<4.4). The left  median APB motor nerve showed prolonged distal onset latency (5.8 ms, N<4.4). The right Median 2nd Digit orthodromic sensory nerve showed prolonged distal peak latency (4.3 ms, N<3.4) and reduced amplitude(4 uV, N>10). The left  Median 2nd Digit orthodromic sensory nerve showed prolonged distal peak latency (4.5 ms, N<3.4) and reduced amplitude(7 uV, N>10).  The right median/ulnar (palm) comparison nerve showed prolonged distal peak latency (Median Palm, 3.1 ms, N<2.2) and abnormal peak latency difference (Median Palm-Ulnar Palm, 1.0 ms, N<0.4) with a relative median delay.    The left median/ulnar (palm) comparison nerve showed prolonged distal peak latency (Median Palm, 3.2  ms, N<2.2) and abnormal peak latency difference (Median Palm-Ulnar Palm, 1.0 ms, N<0.4) with a relative median delay.    All remaining nerves (as indicated in the following tables) were within normal limits.  All muscles (as indicated in the following tables) were within normal limits.    Conclusion: This is an abnormal study. There is electrophysiologic evidence of bilateral moderately-severe Carpal Tunnel Syndrome.  No suggestion of polyneuropathy or radiculopathy.     Sarina Ill, M.D.  [facility] [address] [place], [place] [address] Tel: [phone] Fax: [phone]  Verbal informed consent was obtained from the patient, patient was informed of potential risk of procedure, including bruising, bleeding, hematoma formation,  infection, muscle weakness, muscle pain, numbness, among others.        [place]    Nerve / Sites Muscle Latency Ref. Amplitude Ref. Rel Amp Segments Distance Velocity Ref. Area    ms ms mV mV %  cm m/s m/s mVms  L Median - APB     Wrist APB 5.8 ?4.4 5.0 ?4.0 100 Wrist - APB 7   15.2     Upper arm APB 8.8  4.8  95.2 Upper arm - Wrist 17 56 ?49 17.9  R Median - APB     Wrist APB 5.6 ?4.4 6.0 ?4.0 100 Wrist - APB 7   19.3     Upper arm APB 8.8  6.6  110 Upper arm - Wrist 17 54 ?49 23.4  L Ulnar - ADM     Wrist ADM 3.2 ?3.3 13.7 ?6.0 100 Wrist - ADM 7   30.7     B.Elbow ADM 5.4  12.8  93.4 B.Elbow - Wrist 14 65 ?49 30.0     A.Elbow ADM 6.6  11.7  91.6 A.Elbow - B.Elbow 8 62 ?49 27.4  R Ulnar - ADM     Wrist ADM 3.3 ?3.3 10.1 ?6.0 100 Wrist - ADM 7   26.6     B.Elbow ADM 5.7  10.1  99.8 B.Elbow - Wrist 14 57 ?49 27.8     A.Elbow ADM 7.1  9.7  96.1 A.Elbow - B.Elbow 8 56 ?49 27.1             SNC    Nerve / Sites Rec. Site Peak Lat Ref.  Amp Ref. Segments Distance Peak Diff Ref.    ms ms V V  cm ms  ms  L Median, Ulnar - Transcarpal comparison     Median Palm Wrist 3.2 ?2.2 24 ?35 Median Palm - Wrist 8       Ulnar Palm Wrist 2.2 ?2.2 17 ?12 Ulnar Palm - Wrist 8          Median Palm - Ulnar Palm  1.0 ?0.4  R Median, Ulnar - Transcarpal comparison     Median Palm Wrist 3.1 ?2.2 46 ?35 Median Palm - Wrist 8       Ulnar Palm Wrist 2.1 ?2.2 15 ?12 Ulnar Palm - Wrist 8          Median Palm - Ulnar Palm  1.0 ?0.4  L Median - Orthodromic (Dig II, Mid palm)     Dig II Wrist 4.5 ?3.4 7 ?10 Dig II - Wrist 13    R Median - Orthodromic (Dig II, Mid palm)     Dig II Wrist 4.3 ?3.4 4 ?10 Dig II - Wrist 13    L Ulnar - Orthodromic, (Dig V, Mid palm)     Dig V Wrist 3.0 ?3.1 5 ?5 Dig V - Wrist 11    R Ulnar - Orthodromic, (Dig V, Mid palm)     Dig V Wrist 2.9 ?3.1 5 ?5 Dig V - Wrist 83                   F  Wave    Nerve F Lat Ref.   ms ms  L Ulnar - ADM 24.2 ?32.0  R Ulnar - ADM 23.2 ?32.0          EMG Summary Table    Spontaneous MUAP Recruitment  Muscle IA Fib PSW Fasc Other Amp Dur. Poly Pattern  R. Deltoid Normal None None None _______ Normal Normal Normal Normal  R. Trapezius Normal None None None _______ Normal Normal Normal Normal  R. Pronator teres Normal None None None _______ Normal Normal Normal Normal  R. First dorsal interosseous Normal None None None _______ Normal Normal Normal Normal  R. Opponens pollicis Normal None None None _______ Normal Normal Normal Normal  L. Opponens pollicis Normal None None None _______ Normal Normal Normal Normal

## 2021-03-30 ENCOUNTER — Other Ambulatory Visit (HOSPITAL_COMMUNITY): Payer: Self-pay | Admitting: Internal Medicine

## 2021-03-30 DIAGNOSIS — Z1231 Encounter for screening mammogram for malignant neoplasm of breast: Secondary | ICD-10-CM

## 2021-04-05 ENCOUNTER — Telehealth: Payer: Self-pay | Admitting: Neurology

## 2021-04-05 NOTE — Telephone Encounter (Signed)
Faxed referral to [facility]. Phone: [phone]. Fax: [phone].

## 2021-04-12 DIAGNOSIS — G5603 Carpal tunnel syndrome, bilateral upper limbs: Secondary | ICD-10-CM | POA: Diagnosis not present

## 2021-04-26 ENCOUNTER — Encounter (INDEPENDENT_AMBULATORY_CARE_PROVIDER_SITE_OTHER): Payer: Self-pay | Admitting: Internal Medicine

## 2021-04-26 ENCOUNTER — Other Ambulatory Visit: Payer: Self-pay

## 2021-04-26 ENCOUNTER — Ambulatory Visit (INDEPENDENT_AMBULATORY_CARE_PROVIDER_SITE_OTHER): Payer: Medicare HMO | Admitting: Internal Medicine

## 2021-04-26 VITALS — BP 130/83 | HR 97 | Temp 97.3°F | Resp 18 | Ht 59.0 in | Wt 118.2 lb

## 2021-04-26 DIAGNOSIS — E039 Hypothyroidism, unspecified: Secondary | ICD-10-CM

## 2021-04-26 NOTE — Progress Notes (Signed)
Metrics: Intervention Frequency ACO  Documented Smoking Status Yearly  Screened one or more times in 24 months  Cessation Counseling or  Active cessation medication Past 24 months  Past 24 months   Guideline developer: UpToDate (See UpToDate for funding source) Date Released: 2014       Wellness Office Visit  Subjective:  Patient ID: Kaitlin Jones, female    DOB: [birth year]  Age: 70 y.o. MRN: [id number]  CC: This lady comes in for follow-up of hypothyroidism. HPI  On the last visit, we had switched from levothyroxine to NP thyroid.  She feels much improved and back to her normal self with more energy.  She is excited that she is feeling much better.  She did not have any side effects from NP thyroid. Past Medical History:  Diagnosis Date   HLD (hyperlipidemia) 08/10/2019   Hyperlipidemia    Hypothyroidism, adult 08/10/2019   Osteopenia 08/10/2019   Prediabetes 08/10/2019   Seizures ([place]) 02/16/2020   Vitamin D deficiency disease 08/10/2019   Past Surgical History:  Procedure Laterality Date   CATARACT EXTRACTION Bilateral    07/05/20 and 07/19/20   CHOLECYSTECTOMY  10/1986   COLONOSCOPY N/A 03/06/2020   Procedure: COLONOSCOPY;  Surgeon: [name], MD;  Location: [facility];  Service: Endoscopy;  Laterality: N/A;  8:30   POLYPECTOMY  03/06/2020   Procedure: POLYPECTOMY;  Surgeon: [name], MD;  Location: [facility];  Service: Endoscopy;;     Family History  Problem Relation Age of Onset   Atrial fibrillation Mother    CVA Mother    Stroke Mother    Heart attack Father    Heart disease Father 68       heart attack   Other Brother        enlarge prostate   Cancer Paternal Grandmother        head and neck   Seizures Neg Hx     Social History   Social History Narrative   Never married   Lives with brother [name].   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    Caffeine: none    Social History   Tobacco Use   Smoking status: Never   Smokeless tobacco: Never   Substance Use Topics   Alcohol use: No    Current Meds  Medication Sig   Calcium 250 MG CAPS Take 1 tablet by mouth daily.   Calcium Carb-Cholecalciferol (CALCIUM + D3 PO) Take by mouth daily. Calcium 1200 mg   Cholecalciferol (VITAMIN D3) 50 MCG (2000 UT) TABS Take 2 capsules by mouth daily.    levETIRAcetam (KEPPRA) 250 MG tablet Take 1 tablet (250 mg total) by mouth 2 (two) times daily.   MODERNA COVID-19 VACCINE 100 MCG/0.5ML injection    Propylene Glycol (SYSTANE COMPLETE OP) Apply 1 drop to eye daily. Uses in both eyes   thyroid (NP THYROID) 60 MG tablet Take 1 tablet (60 mg total) by mouth daily before breakfast.     Flowsheet Row Video Visit from 11/21/2020 in [facility]  PHQ-9 Total Score 0       Objective:   Today's Vitals: BP 130/83 (BP Location: Right Arm, Patient Position: Sitting, Cuff Size: Small)   Pulse 97   Temp (!) 97.3 F (36.3 C) (Temporal)   Resp 18   Ht 4\' 11"  (1.499 m)   Wt 118 lb 3.2 oz (53.6 kg)   LMP 11/18/2004 (Exact Date)   SpO2 97%   BMI 23.87  kg/m  Vitals with BMI 04/26/2021 03/08/2021 11/21/2020  Height 4\' 11"  4\' 11"  4\' 11"   Weight 118 lbs 3 oz 120 lbs 6 oz 118 lbs  BMI 23.86 92.3 30.07  Systolic 622 633 (No Data)  Diastolic 83 72 (No Data)  Pulse 97 70 (No Data)     Physical Exam   She systemically well.  Weight is stable.    Assessment   1. Hypothyroidism, adult       Tests ordered Orders Placed This Encounter  Procedures   T3, free   TSH     Plan: Continue with NP thyroid 60 mg daily and we will check thyroid function.  We may need to adjust the dose depending on the results. Follow-up in about 3 months.    No orders of the defined types were placed in this encounter.   Doree Albee, MD

## 2021-04-30 LAB — TSH

## 2021-05-01 ENCOUNTER — Other Ambulatory Visit (INDEPENDENT_AMBULATORY_CARE_PROVIDER_SITE_OTHER): Payer: Medicare HMO

## 2021-05-01 ENCOUNTER — Other Ambulatory Visit (INDEPENDENT_AMBULATORY_CARE_PROVIDER_SITE_OTHER): Payer: Self-pay | Admitting: Internal Medicine

## 2021-05-01 DIAGNOSIS — E039 Hypothyroidism, unspecified: Secondary | ICD-10-CM

## 2021-05-02 ENCOUNTER — Telehealth (INDEPENDENT_AMBULATORY_CARE_PROVIDER_SITE_OTHER): Payer: Medicare HMO | Admitting: Neurology

## 2021-05-02 ENCOUNTER — Encounter: Payer: Self-pay | Admitting: *Deleted

## 2021-05-02 DIAGNOSIS — G40209 Localization-related (focal) (partial) symptomatic epilepsy and epileptic syndromes with complex partial seizures, not intractable, without status epilepticus: Secondary | ICD-10-CM | POA: Diagnosis not present

## 2021-05-02 DIAGNOSIS — G5603 Carpal tunnel syndrome, bilateral upper limbs: Secondary | ICD-10-CM | POA: Diagnosis not present

## 2021-05-02 LAB — TSH: TSH: <0.01 mIU/L — ABNORMAL LOW (ref 0.40–4.50)

## 2021-05-02 LAB — T3, FREE: T3, Free: 5.4 pg/mL — ABNORMAL HIGH (ref 2.3–4.2)

## 2021-05-02 NOTE — Progress Notes (Signed)
Please call the patient and let her know that her thyroid tests are in a good range now so continue with the same dose of NP thyroid.  Follow-up as scheduled.

## 2021-05-02 NOTE — Progress Notes (Signed)
Spoke with patient on phone (confirmed name and DOB) and reviewed chart in preparation for phone visit with Dr Jaynee Eagles.

## 2021-05-02 NOTE — Progress Notes (Signed)
[facility]    Provider:  Dr Jaynee Eagles Requesting Provider: Hurshel Party MD Primary Care Provider:  Doree Albee, MD  CC:  Seizure  Virtual Visit via Telephone Note  I connected with Kaitlin Jones on 05/03/21 at  3:00 PM EDT by telephone and verified that I am speaking with the correct person using two identifiers.  Patient is doing well, she is tolerating the Keppra, no repeat seizures.  We did have a discussion about her carpal tunnel, she is going to the [facility] hand center, currently they have her in wrist splints and then we will try steroids and possibly surgery, I did encourage her to talk to her her doctor and let them know that the wrist splints are not working and that she is interested in surgical intervention.  Discussed to continue wearing her wrist splints, follow-up with hand center, continue medications.  Location: Patient: home Provider: office   I discussed the limitations, risks, security and privacy concerns of performing an evaluation and management service by telephone and the availability of in person appointments. I also discussed with the patient that there may be a patient responsible charge related to this service. The patient expressed understanding and agreed to proceed.      I discussed the assessment and treatment plan with the patient. The patient was provided an opportunity to ask questions and all were answered. The patient agreed with the plan and demonstrated an understanding of the instructions.   The patient was advised to call back or seek an in-person evaluation if the symptoms worsen or if the condition fails to improve as anticipated.  I provided 11 minutes of non-face-to-face time during this encounter.   Melvenia Beam, MD   Interval history July 27, 2020: Patient had an MRI of the brain and EEG routine that were normal, unfortunately she stopped her seizure medication and when she was last seen on May 11, 2020 she reported she had stopped her Keppra 500 twice daily, unfortunately she was then seen in the emergency room in June 23 for second seizure-like event, she was gurgling, shaking and seeming to be having trouble breathing.  The prolonged video EEG did show findings consistent with left frontotemporal sharp wave activity, consistent with an epilepsy focus, putting patient at increased risk for focal epilepsy, and we highly encouraged her to stay on her epilepsy medication.   We spoke today about several questions she had, such as can she work which I assured her she could, we discussed limitations such as not swimming alone, not driving until 6 months seizure-free, I would not recommend working at heights or doing anything at work might hurt herself or others should she have a seizure, she also asked if she had to notify work that she had seizures work with a fire her: I stated it would be illegal for them to fire her because she had seizures and I did recommend that for her safety and other safety that she consider informing her employer about her seizures however that is completely her choice how much of her medical history to share based on the type of employment which I do not know (for example if she were a truck driver she would probably have to disclose her seizures) however I do not think that she is doing anything such as driving trucks or using heavy equipment, we discussed it probably be a good idea for her to get a medical alert bracelet, we reviewed some of them online and  some of the information she might want to put on it, she did ask if this would shorten her life span and we did discuss sudden unexpected death in epilepsy patients however that is rare, she want to know if it will get worse and I could not answer that sometimes even with adequate medication she can have seizures and we will need to adjust but that is why we wait at least 6 months before she drives and that is why we put  precautions on her.  I did recommend if she cannot tolerate Keppra 500 twice daily that we should consider changing her medications as 250 twice a day is not considered therapeutic however may be therapeutic in her case is hard to know, she declined, she like to stay on it, if she does have another seizure she will agree to change medications.   HPI:  Kaitlin Jones is a 70 y.o. female here as requested by Dr. Anastasio Champion for seizure.  Past medical history hypothyroidism, hyperlipidemia, prediabetes.  I reviewed Dr. Lanice Shirts notes, patient recently had an episode of went to the emergency room approximately 3 weeks prior to her last appointment (last seen February 08, 2020) she had loss of consciousness, shaking of limbs and confusion postictally.  She was told not to drive for 6 months.  No further episodes.  CT of the head did not show any major abnormalities.  I also reviewed emergency room notes from January 18, 2020, patient arrived by EMS after her brother who lives with her heard her gurgling, she was less responsive than usual, she had blood in her shirt and a small abrasion on the right side of her tongue, patient did not know why she was in the emergency room and could not remember anything that happened, she was a poor historian however she did attempt to answer some questions.  Brother described her seizure as tonic-clonic activity for about 1 minute then sleepiness afterwards, he had never seen her have a seizure previously, she was recently started on thyroid medication for hypothyroidism.  I reviewed labs which showed unremarkable CBC, CMP, ethanol was negative, urine did not show infection, culture showed no growth, urine rapid drug screen was negative, she was discharged home when stable.  Patient was in her usual state of health, she had been doing house work, no illness, not sick, no fever, no changes in medications, nothing unusual had happened. She laid down to rest and she doesn't remember anything,  her brother who is here provides information, he heard some labored breathing and he saw her eyes were rolled back in her head, she bit her tongue, she was unconscious and had turned white, he called 911 and EMS took her to the hospital while she was fighting them and she doesn't remember.She remembers the ED. She has never had a seizure in the past. No family history of seizures. No episodes since then. No other focal neurologic deficits, associated symptoms, inciting events or modifiable factors.  Reviewed notes, labs and imaging from outside physicians, which showed:  T4 Free normal  Personally reviewed CT head 01/18/2020 and agree with the following: IMPRESSION: 1. No acute intracranial findings. 2. Features of mild chronic microvascular angiopathy and parenchymal volume loss.  Review of Systems: Patient complains of symptoms per HPI as well as the following symptoms: hand pain. Pertinent negatives and positives per HPI. All others negative    Social History   Socioeconomic History   Marital status: Single    Spouse name: Not  on file   Number of children: 0   Years of education: 16   Highest education level: Not on file  Occupational History   Occupation: nursing   Occupation: computers  Tobacco Use   Smoking status: Never   Smokeless tobacco: Never  Vaping Use   Vaping Use: Never used  Substance and Sexual Activity   Alcohol use: No   Drug use: No   Sexual activity: Never    Comment: Never  Other Topics Concern   Not on file  Social History Narrative   Never married   Lives with brother [name].   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    Caffeine: none    Social Determinants of Health   Financial Resource Strain: Not on file  Food Insecurity: Not on file  Transportation Needs: Not on file  Physical Activity: Not on file  Stress: Not on file  Social Connections: Not on file  Intimate Partner Violence: Not on file    Family History  Problem Relation Age of Onset    Atrial fibrillation Mother    CVA Mother    Stroke Mother    Heart attack Father    Heart disease Father 38       heart attack   Other Brother        enlarge prostate   Cancer Paternal Grandmother        head and neck   Seizures Neg Hx     Past Medical History:  Diagnosis Date   HLD (hyperlipidemia) 08/10/2019   Hyperlipidemia    Hypothyroidism, adult 08/10/2019   Osteopenia 08/10/2019   Prediabetes 08/10/2019   Seizures ([place]) 02/16/2020   Vitamin D deficiency disease 08/10/2019    Patient Active Problem List   Diagnosis Date Noted   Partial symptomatic epilepsy with complex partial seizures, not intractable, without status epilepticus ([place]) 07/30/2020   Seizures ([place]) 02/16/2020   Hypothyroidism, adult 08/10/2019   Vitamin D deficiency disease 08/10/2019   Prediabetes 08/10/2019   HLD (hyperlipidemia) 08/10/2019   Osteopenia 08/10/2019   Family history of premature coronary heart disease 08/26/2016    Past Surgical History:  Procedure Laterality Date   CATARACT EXTRACTION Bilateral    07/05/20 and 07/19/20   CHOLECYSTECTOMY  10/1986   COLONOSCOPY N/A 03/06/2020   Procedure: COLONOSCOPY;  Surgeon: [name], MD;  Location: [facility];  Service: Endoscopy;  Laterality: N/A;  8:30   POLYPECTOMY  03/06/2020   Procedure: POLYPECTOMY;  Surgeon: [name], MD;  Location: [facility];  Service: Endoscopy;;    Current Outpatient Medications  Medication Sig Dispense Refill   Calcium Carb-Cholecalciferol (CALCIUM + D3 PO) Take by mouth daily. Calcium 1200 mg     Cholecalciferol (VITAMIN D3) 50 MCG (2000 UT) TABS Take 2 capsules by mouth daily.      levETIRAcetam (KEPPRA) 250 MG tablet Take 1 tablet (250 mg total) by mouth 2 (two) times daily. 180 tablet 3   MODERNA COVID-19 VACCINE 100 MCG/0.5ML injection      Propylene Glycol (SYSTANE COMPLETE OP) Apply 1 drop to eye daily. Uses in both eyes     thyroid (NP THYROID) 60 MG tablet Take 1 tablet (60 mg total) by  mouth daily before breakfast. 30 tablet 3   No current facility-administered medications for this visit.    Allergies as of 05/02/2021   (No Known Allergies)    Vitals: LMP 11/18/2004 (Exact Date)  Last Weight:  Wt Readings from Last 1 Encounters:  04/26/21 118 lb 3.2 oz (53.6 kg)   Last Height:   Ht Readings from Last 1 Encounters:  04/26/21 4\' 11"  (1.499 m)   PRIOR EXAM:  Physical exam: Exam: Gen: NAD, conversant, well nourised, well groomed                     CV: RRR, no MRG. No Carotid Bruits. No peripheral edema, warm, nontender Eyes: Conjunctivae clear without exudates or hemorrhage  Neuro: Detailed Neurologic Exam  Speech:    Speech is normal; fluent and spontaneous with normal comprehension.  Cognition:    The patient is oriented to person, place, and time;     recent and remote memory intact;     language fluent;     normal attention, concentration,     fund of knowledge Cranial Nerves:    The pupils are equal, round, and reactive to light. The fundi are flat. Visual fields are full to finger confrontation. Extraocular movements are intact. Trigeminal sensation is intact and the muscles of mastication are normal. The face is symmetric. The palate elevates in the midline. Hearing intact. Voice is normal. Shoulder shrug is normal. The tongue has normal motion without fasciculations.   Coordination:    No dysmetria   Gait:  normal native gait  Motor Observation:    No asymmetry, no atrophy, and no involuntary movements noted. Tone:    Normal muscle tone.    Posture:    Posture is normal. normal erect    Strength:    Strength is V/V in the upper and lower limbs.      Sensation: intact to LT     Reflex Exam:  DTR's:    Deep tendon reflexes in the upper and lower extremities are normal bilaterally.   Toes:    The toes are equiv bilaterally.   Clonus:    Clonus is absent.   Neurologic exam: Today we spoke on the phone, patient seemed alert,  knowledgeable, intact cognition, she was pleasant, oriented to self, situation, very good historian, denied any changes in her neurologic or physical exam.  Assessment/Plan:  Absolutely lovely patient with an unprovoked generalized seizure, EEG did show epileptiform activity.  I recommend continuing antiepilepsy medications for life.  She cannot tolerate anything higher than Keppra 250 twice daily, I would like to have her on at least 500 twice daily but she declines and she declines changing medications at this time.  250 twice a day may be therapeutic for her is hard to know, I did warn her about repeat breakthrough seizures.  - Patient is doing well, she is tolerating the Keppra, no repeat seizures.   - We did have a discussion about her carpal tunnel, she is going to the [facility] hand center, currently they have her in wrist splints and then we will try steroids and possibly surgery, I did encourage her to talk to her her doctor and let them know that the wrist splints are not working and that she is interested in surgical intervention.  Discussed to continue wearing her wrist splints, follow-up with hand center, continue medications.   Discussed: Per [facility] statutes, patients with seizures are not allowed to drive until they have been seizure-free for six months.    Use caution when using heavy equipment or power tools. Avoid working on ladders or at heights. Take showers instead of baths. Ensure the water temperature is not too high on the home water heater. Do not go swimming alone. Do  not lock yourself in a room alone (i.e. bathroom). When caring for infants or small children, sit down when holding, feeding, or changing them to minimize risk of injury to the child in the event you have a seizure. Maintain good sleep hygiene. Avoid alcohol.    If patient has another seizure, call 911 and bring them back to the ED if: A.  The seizure lasts longer than 5 minutes.      B.  The patient  doesn't wake shortly after the seizure or has new problems such as difficulty seeing, speaking or moving following the seizure C.  The patient was injured during the seizure D.  The patient has a temperature over 102 F (39C) E.  The patient vomited during the seizure and now is having trouble breathing  Per [facility] statutes, patients with seizures are not allowed to drive until they have been seizure-free for six months.  Other recommendations include using caution when using heavy equipment or power tools. Avoid working on ladders or at heights. Take showers instead of baths.  Do not swim alone.  Ensure the water temperature is not too high on the home water heater. Do not go swimming alone. Do not lock yourself in a room alone (i.e. bathroom). When caring for infants or small children, sit down when holding, feeding, or changing them to minimize risk of injury to the child in the event you have a seizure. Maintain good sleep hygiene. Avoid alcohol.  Also recommend adequate sleep, hydration, good diet and minimize stress.  During the Seizure  - First, ensure adequate ventilation and place patients on the floor on their left side  Loosen clothing around the neck and ensure the airway is patent. If the patient is clenching the teeth, do not force the mouth open with any object as this can cause severe damage - Remove all items from the surrounding that can be hazardous. The patient may be oblivious to what's happening and may not even know what he or she is doing. If the patient is confused and wandering, either gently guide him/her away and block access to outside areas - Reassure the individual and be comforting - Call 911. In most cases, the seizure ends before EMS arrives. However, there are cases when seizures may last over 3 to 5 minutes. Or the individual may have developed breathing difficulties or severe injuries. If a pregnant patient or a person with diabetes develops a seizure, it  is prudent to call an ambulance. - Finally, if the patient does not regain full consciousness, then call EMS. Most patients will remain confused for about 45 to 90 minutes after a seizure, so you must use judgment in calling for help. - Avoid restraints but make sure the patient is in a bed with padded side rails - Place the individual in a lateral position with the neck slightly flexed; this will help the saliva drain from the mouth and prevent the tongue from falling backward - Remove all nearby furniture and other hazards from the area - Provide verbal assurance as the individual is regaining consciousness - Provide the patient with privacy if possible - Call for help and start treatment as ordered by the caregiver   fter the Seizure (Postictal Stage)  After a seizure, most patients experience confusion, fatigue, muscle pain and/or a headache. Thus, one should permit the individual to sleep. For the next few days, reassurance is essential. Being calm and helping reorient the person is also of importance.  Most seizures are painless and end spontaneously. Seizures are not harmful to others but can lead to complications such as stress on the lungs, brain and the heart. Individuals with prior lung problems may develop labored breathing and respiratory distress.     Meds ordered this encounter  Medications   levETIRAcetam (KEPPRA) 250 MG tablet    Sig: Take 1 tablet (250 mg total) by mouth 2 (two) times daily.    Dispense:  180 tablet    Refill:  3     Cc: Doree Albee, MD,  Doree Albee, MD  Sarina Ill, MD  [facility] [address] [place],  [address]  Phone [phone] Fax [phone]

## 2021-05-03 MED ORDER — LEVETIRACETAM 250 MG PO TABS: 250.0000 mg | ORAL_TABLET | Freq: Two times a day (BID) | ORAL | 3 refills | Status: DC

## 2021-05-10 DIAGNOSIS — G5603 Carpal tunnel syndrome, bilateral upper limbs: Secondary | ICD-10-CM | POA: Diagnosis not present

## 2021-05-11 ENCOUNTER — Ambulatory Visit (HOSPITAL_COMMUNITY)
Admission: RE | Admit: 2021-05-11 | Discharge: 2021-05-11 | Disposition: A | Discharge status: Home / Self Care | Payer: Medicare HMO | Source: Ambulatory Visit | Attending: Internal Medicine | Admitting: Internal Medicine

## 2021-05-11 ENCOUNTER — Other Ambulatory Visit: Payer: Self-pay

## 2021-05-11 DIAGNOSIS — Z1231 Encounter for screening mammogram for malignant neoplasm of breast: Secondary | ICD-10-CM | POA: Insufficient documentation

## 2021-05-18 DIAGNOSIS — G5602 Carpal tunnel syndrome, left upper limb: Secondary | ICD-10-CM | POA: Diagnosis not present

## 2021-06-21 ENCOUNTER — Encounter (INDEPENDENT_AMBULATORY_CARE_PROVIDER_SITE_OTHER): Payer: Self-pay

## 2021-06-25 DIAGNOSIS — Z961 Presence of intraocular lens: Secondary | ICD-10-CM | POA: Diagnosis not present

## 2021-06-25 DIAGNOSIS — H5213 Myopia, bilateral: Secondary | ICD-10-CM | POA: Diagnosis not present

## 2021-06-25 DIAGNOSIS — H524 Presbyopia: Secondary | ICD-10-CM | POA: Diagnosis not present

## 2021-06-28 ENCOUNTER — Telehealth (INDEPENDENT_AMBULATORY_CARE_PROVIDER_SITE_OTHER): Payer: Self-pay

## 2021-06-28 DIAGNOSIS — E039 Hypothyroidism, unspecified: Secondary | ICD-10-CM

## 2021-06-28 MED ORDER — THYROID 30 MG PO TABS: 30.0000 mg | ORAL_TABLET | Freq: Every day | ORAL | 0 refills | Status: DC

## 2021-06-28 NOTE — Telephone Encounter (Signed)
Please call patient let her know that I reviewed her chart and I think she needs to reduce her dose of thyroid.  So I will refill 30 mg tablets and she should take 1 tablet by mouth daily.  Please also make sure she knows she needs to find another primary care provider soon as possible is that she can get her thyroid panel rechecked within the next 6 to 8 weeks.  If she needs a referral to a endocrinologist I can also order this.  Please let me know.  Thank you.

## 2021-06-28 NOTE — Telephone Encounter (Signed)
Patient called and is requesting a refill of the following medication:  thyroid (NP THYROID) 60 MG tablet  Last filled 03/08/2021, # 30 with 3 refills

## 2021-07-02 NOTE — Telephone Encounter (Signed)
Called patient and gave her the message. Patient verbalized an understanding and she stated that they have already reached out to primary care and awaiting an appointment.

## 2021-07-04 ENCOUNTER — Other Ambulatory Visit (INDEPENDENT_AMBULATORY_CARE_PROVIDER_SITE_OTHER): Payer: Self-pay

## 2021-08-02 ENCOUNTER — Ambulatory Visit (INDEPENDENT_AMBULATORY_CARE_PROVIDER_SITE_OTHER): Payer: Medicare HMO | Admitting: Internal Medicine

## 2021-09-14 ENCOUNTER — Encounter (INDEPENDENT_AMBULATORY_CARE_PROVIDER_SITE_OTHER): Payer: Self-pay

## 2021-09-14 ENCOUNTER — Ambulatory Visit (INDEPENDENT_AMBULATORY_CARE_PROVIDER_SITE_OTHER): Payer: Medicare HMO | Admitting: Internal Medicine

## 2021-09-14 ENCOUNTER — Other Ambulatory Visit: Payer: Self-pay

## 2021-09-14 ENCOUNTER — Encounter: Payer: Self-pay | Admitting: Internal Medicine

## 2021-09-14 VITALS — BP 122/64 | HR 63 | Temp 98.4°F | Resp 16 | Ht 59.0 in | Wt 120.0 lb

## 2021-09-14 DIAGNOSIS — Z2821 Immunization not carried out because of patient refusal: Secondary | ICD-10-CM

## 2021-09-14 DIAGNOSIS — G40209 Localization-related (focal) (partial) symptomatic epilepsy and epileptic syndromes with complex partial seizures, not intractable, without status epilepticus: Secondary | ICD-10-CM

## 2021-09-14 DIAGNOSIS — E039 Hypothyroidism, unspecified: Secondary | ICD-10-CM

## 2021-09-14 DIAGNOSIS — M8580 Other specified disorders of bone density and structure, unspecified site: Secondary | ICD-10-CM

## 2021-09-14 DIAGNOSIS — Z0000 Encounter for general adult medical examination without abnormal findings: Secondary | ICD-10-CM | POA: Insufficient documentation

## 2021-09-14 DIAGNOSIS — Z0001 Encounter for general adult medical examination with abnormal findings: Secondary | ICD-10-CM | POA: Diagnosis not present

## 2021-09-14 DIAGNOSIS — E559 Vitamin D deficiency, unspecified: Secondary | ICD-10-CM

## 2021-09-14 NOTE — Assessment & Plan Note (Signed)
Lab Results  Component Value Date   TSH <0.01 (L) 05/01/2021   Appears to be over supplemented DC NP thyroid/[place] Thyroid Check TSH and free T4 after 3 months

## 2021-09-14 NOTE — Assessment & Plan Note (Signed)
Last vitamin D Lab Results  Component Value Date   VD25OH 86 11/27/2020   Takes calcium plus vitamin D supplement Advised to discontinue additional vitamin D supplement

## 2021-09-14 NOTE — Progress Notes (Signed)
 New Patient Office Visit  Subjective:  Patient ID: Kaitlin Jones, female    DOB: 12/23/1950  Age: 70 y.o. MRN: [id number]  CC:  Chief Complaint  Patient presents with   New Patient (Initial Visit)    New patient was seeing dr gosrani     HPI Kaitlin Jones is a 70-year-old female with PMH of osteopenia, ?  Hypothyroidism and seizures who presents for Establishing care.  Her brother is present during the visit, who also has a visit today.  Chart review suggests oversupplemented thyroid hormones.  She has been taking Armour Thyroid 30 mg daily.  She was initially started on NP thyroid for weight gain and was told of hypothyroidism. Chart review does not show any elevated TSH or low T4 results.  She denies any recent change in weight or appetite.  Denies any recent change in skin or hair.  She takes Keppra for history of complex partial seizures.  She follows up with Dr. Ahern.  Does not report any recent history of seizures.  She has history of osteopenia.  She takes calcium 1200 mg plus vitamin D3 1000 IU daily.  She also takes an additional vitamin D 2000 IU daily.  She has had 3 doses of COVID vaccine.  She refused flu vaccine today.     Past Medical History:  Diagnosis Date   HLD (hyperlipidemia) 08/10/2019   Hyperlipidemia    Hypothyroidism, adult 08/10/2019   Osteopenia 08/10/2019   Prediabetes 08/10/2019   Seizures (HCC) 02/16/2020   Vitamin D deficiency disease 08/10/2019    Past Surgical History:  Procedure Laterality Date   CATARACT EXTRACTION Bilateral    07/05/20 and 07/19/20   CHOLECYSTECTOMY  10/1986   COLONOSCOPY N/A 03/06/2020   Procedure: COLONOSCOPY;  Surgeon: Fields, Sandi L, MD;  [facility]: [facility];  Service: Endoscopy;  Laterality: N/A;  8:30   POLYPECTOMY  03/06/2020   Procedure: POLYPECTOMY;  Surgeon: Fields, Sandi L, MD;  Location: [facility];  Service: Endoscopy;;    Family History  Problem Relation Age of Onset   Atrial fibrillation Mother    CVA  Mother    Stroke Mother    Heart attack Father    Heart disease Father 48       heart attack   Other Brother        enlarge prostate   Cancer Paternal Grandmother        head and neck   Seizures Neg Hx     Social History   Socioeconomic History   Marital status: Single    Spouse name: Not on file   Number of children: 0   Years of education: 16   Highest education level: Not on file  Occupational History   Occupation: nursing   Occupation: computers  Tobacco Use   Smoking status: Never   Smokeless tobacco: Never  Vaping Use   Vaping Use: Never used  Substance and Sexual Activity   Alcohol use: No   Drug use: No   Sexual activity: Never    Comment: Never  Other Topics Concern   Not on file  Social History Narrative   Never married   Lives with brother John.   Retired Nurse.   She has 3 degrees.    Caffeine: none    Social Determinants of Health   Financial Resource Strain: Not on file  Food Insecurity: Not on file  Transportation Needs: Not on file  Physical Activity: Not on file  Stress: Not on file    Social Connections: Not on file  Intimate Partner Violence: Not on file    ROS Review of Systems  Constitutional:  Negative for chills and fever.  HENT:  Negative for congestion, sinus pressure, sinus pain and sore throat.   Eyes:  Negative for pain and discharge.  Respiratory:  Negative for cough and shortness of breath.   Cardiovascular:  Negative for chest pain and palpitations.  Gastrointestinal:  Negative for abdominal pain, constipation, diarrhea, nausea and vomiting.  Endocrine: Negative for polydipsia and polyuria.  Genitourinary:  Negative for dysuria and hematuria.  Musculoskeletal:  Negative for neck pain and neck stiffness.  Skin:  Negative for rash.  Neurological:  Negative for dizziness and weakness.  Psychiatric/Behavioral:  Negative for agitation and behavioral problems.    Objective:   Today's Vitals: BP 122/64 (BP Location: Left Arm,  Cuff Size: Normal)   Pulse 63   Temp 98.4 F (36.9 C) (Oral)   Resp 16   Ht 4' 11" (1.499 m)   Wt 120 lb (54.4 kg)   LMP 11/18/2004 (Exact Date)   SpO2 100%   BMI 24.24 kg/m   Physical Exam Vitals reviewed.  Constitutional:      General: She is not in acute distress.    Appearance: She is not diaphoretic.  HENT:     Head: Normocephalic and atraumatic.     Nose: Nose normal.     Mouth/Throat:     Mouth: Mucous membranes are moist.  Eyes:     General: No scleral icterus.    Extraocular Movements: Extraocular movements intact.  Cardiovascular:     Rate and Rhythm: Normal rate and regular rhythm.     Pulses: Normal pulses.     Heart sounds: Normal heart sounds. No murmur heard. Pulmonary:     Breath sounds: Normal breath sounds. No wheezing or rales.  Musculoskeletal:     Cervical back: Neck supple. No tenderness.     Right lower leg: No edema.     Left lower leg: No edema.  Skin:    General: Skin is warm.     Findings: No rash.  Neurological:     General: No focal deficit present.     Mental Status: She is alert and oriented to person, place, and time.     Sensory: No sensory deficit.     Motor: No weakness.  Psychiatric:        Mood and Affect: Mood normal.        Behavior: Behavior normal.    Assessment & Plan:   Problem List Items Addressed This Visit       Endocrine   Hypothyroidism, adult - Primary    Lab Results  Component Value Date   TSH <0.01 (L) 05/01/2021  Appears to be over supplemented DC NP thyroid/Armour Thyroid Check TSH and free T4 after 3 months      Relevant Orders   TSH + free T4     Nervous and Auditory   Partial symptomatic epilepsy with complex partial seizures, not intractable, without status epilepticus ([place])    Takes Keppra, followed by Neurology No recent episodes of seizures.        Musculoskeletal and Integument   Osteopenia    Takes calcium plus vitamin D supplement        Other   Vitamin D deficiency    Last  vitamin D Lab Results  Component Value Date   VD25OH 50 11/27/2020  Takes calcium plus vitamin D supplement Advised to discontinue additional  vitamin D supplement      Relevant Orders   Vitamin D (25 hydroxy)   Encounter for medical examination to establish care    Care established Previous chart reviewed History and medications reviewed with the patient      Relevant Orders   CBC with Differential/Platelet   CMP14+EGFR   Lipid Profile    Outpatient Encounter Medications as of 09/14/2021  Medication Sig   Calcium Carb-Cholecalciferol (CALCIUM + D3 PO) Take by mouth daily. Calcium 1200 mg   levETIRAcetam (KEPPRA) 250 MG tablet Take 1 tablet (250 mg total) by mouth 2 (two) times daily.   Propylene Glycol (SYSTANE COMPLETE OP) Apply 1 drop to eye daily. Uses in both eyes   [DISCONTINUED] Cholecalciferol (VITAMIN D3) 50 MCG (2000 UT) TABS Take 2 capsules by mouth daily.    [DISCONTINUED] thyroid (ARMOUR) 30 MG tablet Take 30 mg by mouth daily before breakfast. Take 1 tablet by mouth daily before breakfast   [DISCONTINUED] MODERNA COVID-19 VACCINE 100 MCG/0.5ML injection  (Patient not taking: Reported on 09/14/2021)   [DISCONTINUED] thyroid (NP THYROID) 30 MG tablet Take 1 tablet (30 mg total) by mouth daily before breakfast. (Patient not taking: No sig reported)   No facility-administered encounter medications on file as of 09/14/2021.    Follow-up: Return in about 5 months (around 02/12/2022) for Annual physical.   Lindell Spar, MD

## 2021-09-14 NOTE — Assessment & Plan Note (Addendum)
Care established Previous chart reviewed History and medications reviewed with the patient 

## 2021-09-14 NOTE — Assessment & Plan Note (Signed)
Takes calcium plus vitamin D supplement 

## 2021-09-14 NOTE — Assessment & Plan Note (Signed)
Takes Keppra, followed by Neurology No recent episodes of seizures. 

## 2021-09-14 NOTE — Patient Instructions (Addendum)
Please stop taking Vitamin D 2000 IU.  Please stop taking NP thyroid.  Please get fasting blood tests done after 3 months.

## 2021-09-20 ENCOUNTER — Other Ambulatory Visit: Payer: Self-pay

## 2021-09-20 ENCOUNTER — Ambulatory Visit (INDEPENDENT_AMBULATORY_CARE_PROVIDER_SITE_OTHER): Payer: Medicare HMO | Admitting: *Deleted

## 2021-09-20 ENCOUNTER — Telehealth: Payer: Self-pay | Admitting: *Deleted

## 2021-09-20 DIAGNOSIS — Z0000 Encounter for general adult medical examination without abnormal findings: Secondary | ICD-10-CM | POA: Diagnosis not present

## 2021-09-20 NOTE — Progress Notes (Signed)
Subjective:   Kaitlin Jones is a 70 y.o. female who presents for Medicare Annual (Subsequent) preventive examination. I connected with  Kaitlin Jones on 09/20/21 by audio enabled telemedicine application and verified that I am speaking with the correct person using two identifiers.   I discussed the limitations of evaluation and management by telemedicine. The patient expressed understanding and agreed to proceed.   Review of Systems           Objective:    There were no vitals filed for this visit. There is no height or weight on file to calculate BMI.  Advanced Directives 03/06/2020 01/18/2020  Does Patient Have a Medical Advance Directive? Yes No  Type of Advance Directive Living will -  Would patient like information on creating a medical advance directive? - No - Patient declined    Current Medications (verified) Outpatient Encounter Medications as of 09/20/2021  Medication Sig   Calcium Carb-Cholecalciferol (CALCIUM + D3 PO) Take by mouth daily. Calcium 1200 mg   levETIRAcetam (KEPPRA) 250 MG tablet Take 1 tablet (250 mg total) by mouth 2 (two) times daily.   Propylene Glycol (SYSTANE COMPLETE OP) Apply 1 drop to eye daily. Uses in both eyes   No facility-administered encounter medications on file as of 09/20/2021.    Allergies (verified) Patient has no known allergies.   History: Past Medical History:  Diagnosis Date   HLD (hyperlipidemia) 08/10/2019   Hyperlipidemia    Hypothyroidism, adult 08/10/2019   Osteopenia 08/10/2019   Prediabetes 08/10/2019   Seizures (Kaitlin Jones) 02/16/2020   Vitamin D deficiency disease 08/10/2019   Past Surgical History:  Procedure Laterality Date   CATARACT EXTRACTION Bilateral    07/05/20 and 07/19/20   CHOLECYSTECTOMY  10/1986   COLONOSCOPY N/A 03/06/2020   Procedure: COLONOSCOPY;  Surgeon: Kaitlin Binder, MD;  [facility];  Service: Endoscopy;  Laterality: N/A;  8:30   POLYPECTOMY  03/06/2020   Procedure: POLYPECTOMY;  Surgeon:  Kaitlin Binder, MD;  [facility];  Service: Endoscopy;;   Family History  Problem Relation Age of Onset   Atrial fibrillation Mother    CVA Mother    Stroke Mother    Heart attack Father    Heart disease Father 61       heart attack   Other Brother        enlarge prostate   Cancer Paternal Grandmother        head and neck   Seizures Neg Hx    Social History   Socioeconomic History   Marital status: Single    Spouse name: Not on file   Number of children: 0   Years of education: 16   Highest education level: Not on file  Occupational History   Occupation: nursing   Occupation: computers  Tobacco Use   Smoking status: Never   Smokeless tobacco: Never  Vaping Use   Vaping Use: Never used  Substance and Sexual Activity   Alcohol use: No   Drug use: No   Sexual activity: Never    Comment: Never  Other Topics Concern   Not on file  Social History Narrative   Never married   Lives with brother Kaitlin Jones.   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    Caffeine: none    Social Determinants of Radio broadcast assistant Strain: Not on file  Food Insecurity: Not on file  Transportation Needs: Not on file  Physical Activity: Not on file  Stress: Not on file  Social Connections: Not on file    Tobacco Counseling Counseling given: Not Answered   Clinical Intake:                 Diabetic?No         Activities of Daily Living In your present state of health, do you have any difficulty performing the following activities: 04/26/2021  Hearing? N  Vision? Y  Difficulty concentrating or making decisions? N  Walking or climbing stairs? N  Dressing or bathing? N  Doing errands, shopping? N  Some recent data might be hidden    Patient Care Team: Kaitlin Spar, MD as PCP - General (Internal Medicine) Kaitlin Jones, Kaitlin Estimable, MD as Consulting Physician (Gastroenterology)  Indicate any recent Medical Services you may have received from other than [facility]  providers in the past year (date may be approximate).     Assessment:   This is a routine wellness examination for Kaitlin Jones.  Hearing/Vision screen No results found.  Dietary issues and exercise activities discussed:     Goals Addressed   None   Depression Screen PHQ 2/9 Scores 09/14/2021 11/21/2020 02/08/2020 04/03/2017  PHQ - 2 Score 0 0 0 0  PHQ- 9 Score - 0 - -  Exception Documentation - - Medical reason -    Fall Risk Fall Risk  09/14/2021 11/21/2020 02/08/2020 08/10/2019 04/03/2017  Falls in the past year? 0 0 0 0 Yes  Number falls in past yr: 0 - 0 0 1  Injury with Fall? 0 - 0 0 No  Risk for fall due to : No Fall Risks - No Fall Risks - -  Follow up Falls evaluation completed - Falls evaluation completed - -    FALL RISK PREVENTION PERTAINING TO THE HOME:  Any stairs in or around the home? Yes  If so, are there any without handrails? No  Home free of loose throw rugs in walkways, pet beds, electrical cords, etc? No  Adequate lighting in your home to reduce risk of falls? Yes   ASSISTIVE DEVICES UTILIZED TO PREVENT FALLS:  Life alert? Yes  Use of a cane, Kaitlin Jones or w/c? No  Grab bars in the bathroom? Yes  Shower chair or bench in shower? No  Elevated toilet seat or a handicapped toilet? No   TIMED UP AND GO:  Was the test performed? No .  Length of time to ambulate 10 feet: NA sec.     Cognitive Function:        Immunizations Immunization History  Administered Date(s) Administered   Janssen (J&J) SARS-COV-2 Vaccination 02/06/2020   Moderna Sars-Covid-2 Vaccination 09/11/2020, 02/16/2021   Pneumococcal Conjugate-13 04/03/2017   Tdap 03/08/2021   Zoster Recombinat (Shingrix) 09/11/2017, 01/01/2018    TDAP status: Up to date  Flu Vaccine status: Declined, Education has been provided regarding the importance of this vaccine but patient still declined. Advised may receive this vaccine at local pharmacy or Health Dept. Aware to provide a copy of the  vaccination record if obtained from local pharmacy or Health Dept. Verbalized acceptance and understanding.  Pneumococcal vaccine status: Up to date  Covid-19 vaccine status: Completed vaccines  Qualifies for Shingles Vaccine? Yes   Zostavax completed Yes   Shingrix Completed?: Yes  Screening Tests Health Maintenance  Topic Date Due   Pneumonia Vaccine 45+ Years old (2 - PPSV23 if available, else PCV20) 04/03/2018   COVID-19 Vaccine (4 - Booster for Janssen series) 04/13/2021   INFLUENZA VACCINE  Never  done   MAMMOGRAM  05/12/2023   COLONOSCOPY (Pts 45-10yrs Insurance coverage will need to be confirmed)  03/06/2030   TETANUS/TDAP  03/09/2031   DEXA SCAN  Completed   Hepatitis C Screening  Completed   Zoster Vaccines- Shingrix  Completed   HPV VACCINES  Aged Out    Health Maintenance  Health Maintenance Due  Topic Date Due   Pneumonia Vaccine 28+ Years old (2 - PPSV23 if available, else PCV20) 04/03/2018   COVID-19 Vaccine (4 - Booster for Janssen series) 04/13/2021   INFLUENZA VACCINE  Never done    Colorectal cancer screening: Type of screening: Colonoscopy. Completed 03/06/2020. Repeat every 10 years  Mammogram status: Completed 05/11/2021. Repeat every year  Bone Density status: Completed 04/17/2017. Results reflect: Bone density results: OSTEOPENIA. Repeat every 5 years.  Lung Cancer Screening: (Low Dose CT Chest recommended if Age 5-80 years, 30 pack-year currently smoking OR have quit w/in 15years.) does not qualify.   Lung Cancer Screening Referral: NA  Additional Screening:  Hepatitis C Screening: does not qualify; Completed 08/10/2019  Vision Screening: Recommended annual ophthalmology exams for early detection of glaucoma and other disorders of the eye. Is the patient up to date with their annual eye exam?  Yes  Who is the provider or what is the name of the office in which the patient attends annual eye exams? [facility] If pt is not established  with a provider, would they like to be referred to a provider to establish care? No .   Dental Screening: Recommended annual dental exams for proper oral hygiene  Community Resource Referral / Chronic Care Management: CRR required this visit?  No   CCM required this visit?  No      Plan:     I have personally reviewed and noted the following in the patient's chart:   Medical and social history Use of alcohol, tobacco or illicit drugs  Current medications and supplements including opioid prescriptions.  Functional ability and status Nutritional status Physical activity Advanced directives List of other physicians Hospitalizations, surgeries, and ER visits in previous 12 months Vitals Screenings to include cognitive, depression, and falls Referrals and appointments  In addition, I have reviewed and discussed with patient certain preventive protocols, quality metrics, and best practice recommendations. A written personalized care plan for preventive services as well as general preventive health recommendations were provided to patient.     Danella Penton, [place]   09/20/2021   Nurse Notes: This was a telehealth visit. The patient was at home, the provider was in the office. Ihor Dow, MD

## 2021-09-20 NOTE — Telephone Encounter (Signed)
Called patient today for Phone AWV. Patient states she does have a hard time getting to sleep some nights. She feels like her brain will not shut off. She wanted to know if there is anything OTC you can recommend for her to take to help her get to sleep.

## 2021-09-20 NOTE — Patient Instructions (Signed)
Kaitlin Jones , Thank you for taking time to come for your Medicare Wellness Visit. I appreciate your ongoing commitment to your health goals. Please review the following plan we discussed and let me know if I can assist you in the future.   Screening recommendations/referrals: Colonoscopy: Completed 03/06/2020 Mammogram: Completed 05/11/2021 Bone Density: Completed 04/17/2017 Recommended yearly ophthalmology/optometry visit for glaucoma screening and checkup Recommended yearly dental visit for hygiene and checkup  Vaccinations: Influenza vaccine: Patient Declined Pneumococcal vaccine: Up to date Tdap vaccine: Completed Shingles vaccine: Completed    Advanced directives: Patient has Living Will and Healthcare POA. Will bring copy in for [place] to place in her chart  Conditions/risks identified: Hypothyroidism, Hyperlipidemia  Next appointment: 1 year   Preventive Care 70 Years and Older, Female Preventive care refers to lifestyle choices and visits with your health care provider that can promote health and wellness. What does preventive care include? A yearly physical exam. This is also called an annual well check. Dental exams once or twice a year. Routine eye exams. Ask your health care provider how often you should have your eyes checked. Personal lifestyle choices, including: Daily care of your teeth and gums. Regular physical activity. Eating a healthy diet. Avoiding tobacco and drug use. Limiting alcohol use. Practicing safe sex. Taking low-dose aspirin every day. Taking vitamin and mineral supplements as recommended by your health care provider. What happens during an annual well check? The services and screenings done by your health care provider during your annual well check will depend on your age, overall health, lifestyle risk factors, and family history of disease. Counseling  Your health care provider may ask you questions about your: Alcohol use. Tobacco use. Drug  use. Emotional well-being. Home and relationship well-being. Sexual activity. Eating habits. History of falls. Memory and ability to understand (cognition). Work and work Statistician. Reproductive health. Screening  You may have the following tests or measurements: Height, weight, and BMI. Blood pressure. Lipid and cholesterol levels. These may be checked every 5 years, or more frequently if you are over 75 years old. Skin check. Lung cancer screening. You may have this screening every year starting at age 35 if you have a 30-pack-year history of smoking and currently smoke or have quit within the past 15 years. Fecal occult blood test (FOBT) of the stool. You may have this test every year starting at age 57. Flexible sigmoidoscopy or colonoscopy. You may have a sigmoidoscopy every 5 years or a colonoscopy every 10 years starting at age 47. Hepatitis C blood test. Hepatitis B blood test. Sexually transmitted disease (STD) testing. Diabetes screening. This is done by checking your blood sugar (glucose) after you have not eaten for a while (fasting). You may have this done every 1-3 years. Bone density scan. This is done to screen for osteoporosis. You may have this done starting at age 46. Mammogram. This may be done every 1-2 years. Talk to your health care provider about how often you should have regular mammograms. Talk with your health care provider about your test results, treatment options, and if necessary, the need for more tests. Vaccines  Your health care provider may recommend certain vaccines, such as: Influenza vaccine. This is recommended every year. Tetanus, diphtheria, and acellular pertussis (Tdap, Td) vaccine. You may need a Td booster every 10 years. Zoster vaccine. You may need this after age 32. Pneumococcal 13-valent conjugate (PCV13) vaccine. One dose is recommended after age 65. Pneumococcal polysaccharide (PPSV23) vaccine. One dose is recommended after  age  47. Talk to your health care provider about which screenings and vaccines you need and how often you need them. This information is not intended to replace advice given to you by your health care provider. Make sure you discuss any questions you have with your health care provider. Document Released: 12/01/2015 Document Revised: 07/24/2016 Document Reviewed: 09/05/2015 Elsevier Interactive Patient Education  2017 [employer] Prevention in the Home Falls can cause injuries. They can happen to people of all ages. There are many things you can do to make your home safe and to help prevent falls. What can I do on the outside of my home? Regularly fix the edges of walkways and driveways and fix any cracks. Remove anything that might make you trip as you walk through a door, such as a raised step or threshold. Trim any bushes or trees on the path to your home. Use bright outdoor lighting. Clear any walking paths of anything that might make someone trip, such as rocks or tools. Regularly check to see if handrails are loose or broken. Make sure that both sides of any steps have handrails. Any raised decks and porches should have guardrails on the edges. Have any leaves, snow, or ice cleared regularly. Use sand or salt on walking paths during winter. Clean up any spills in your garage right away. This includes oil or grease spills. What can I do in the bathroom? Use night lights. Install grab bars by the toilet and in the tub and shower. Do not use towel bars as grab bars. Use non-skid mats or decals in the tub or shower. If you need to sit down in the shower, use a plastic, non-slip stool. Keep the floor dry. Clean up any water that spills on the floor as soon as it happens. Remove soap buildup in the tub or shower regularly. Attach bath mats securely with double-sided non-slip rug tape. Do not have throw rugs and other things on the floor that can make you trip. What can I do in the  bedroom? Use night lights. Make sure that you have a light by your bed that is easy to reach. Do not use any sheets or blankets that are too big for your bed. They should not hang down onto the floor. Have a firm chair that has side arms. You can use this for support while you get dressed. Do not have throw rugs and other things on the floor that can make you trip. What can I do in the kitchen? Clean up any spills right away. Avoid walking on wet floors. Keep items that you use a lot in easy-to-reach places. If you need to reach something above you, use a strong step stool that has a grab bar. Keep electrical cords out of the way. Do not use floor polish or wax that makes floors slippery. If you must use wax, use non-skid floor wax. Do not have throw rugs and other things on the floor that can make you trip. What can I do with my stairs? Do not leave any items on the stairs. Make sure that there are handrails on both sides of the stairs and use them. Fix handrails that are broken or loose. Make sure that handrails are as long as the stairways. Check any carpeting to make sure that it is firmly attached to the stairs. Fix any carpet that is loose or worn. Avoid having throw rugs at the top or bottom of the stairs. If you do  have throw rugs, attach them to the floor with carpet tape. Make sure that you have a light switch at the top of the stairs and the bottom of the stairs. If you do not have them, ask someone to add them for you. What else can I do to help prevent falls? Wear shoes that: Do not have high heels. Have rubber bottoms. Are comfortable and fit you well. Are closed at the toe. Do not wear sandals. If you use a stepladder: Make sure that it is fully opened. Do not climb a closed stepladder. Make sure that both sides of the stepladder are locked into place. Ask someone to hold it for you, if possible. Clearly mark and make sure that you can see: Any grab bars or  handrails. First and last steps. Where the edge of each step is. Use tools that help you move around (mobility aids) if they are needed. These include: Canes. Walkers. Scooters. Crutches. Turn on the lights when you go into a dark area. Replace any light bulbs as soon as they burn out. Set up your furniture so you have a clear path. Avoid moving your furniture around. If any of your floors are uneven, fix them. If there are any pets around you, be aware of where they are. Review your medicines with your doctor. Some medicines can make you feel dizzy. This can increase your chance of falling. Ask your doctor what other things that you can do to help prevent falls. This information is not intended to replace advice given to you by your health care provider. Make sure you discuss any questions you have with your health care provider. Document Released: 08/31/2009 Document Revised: 04/11/2016 Document Reviewed: 12/09/2014 Elsevier Interactive Patient Education  2017 [employer].

## 2021-09-20 NOTE — Telephone Encounter (Signed)
Patient aware of providers recommendations.  

## 2021-09-20 NOTE — Telephone Encounter (Signed)
Called and notified patient of recommendations, she verbalized understanding. Patient also questioned if she should still be taking her THyroid medicine? It was not listed on her med list.

## 2021-11-22 ENCOUNTER — Other Ambulatory Visit (INDEPENDENT_AMBULATORY_CARE_PROVIDER_SITE_OTHER): Payer: Self-pay | Admitting: Nurse Practitioner

## 2021-11-22 DIAGNOSIS — E039 Hypothyroidism, unspecified: Secondary | ICD-10-CM

## 2021-12-14 DIAGNOSIS — E559 Vitamin D deficiency, unspecified: Secondary | ICD-10-CM | POA: Diagnosis not present

## 2021-12-14 DIAGNOSIS — E039 Hypothyroidism, unspecified: Secondary | ICD-10-CM | POA: Diagnosis not present

## 2021-12-14 DIAGNOSIS — Z0000 Encounter for general adult medical examination without abnormal findings: Secondary | ICD-10-CM | POA: Diagnosis not present

## 2021-12-15 LAB — CMP14+EGFR
ALT: 19 IU/L (ref 0–32)
AST: 26 IU/L (ref 0–40)
Albumin/Globulin Ratio: 1.4 (ref 1.2–2.2)
Albumin: 4 g/dL (ref 3.8–4.8)
Alkaline Phosphatase: 66 IU/L (ref 44–121)
BUN/Creatinine Ratio: 21 (ref 12–28)
BUN: 20 mg/dL (ref 8–27)
Bilirubin Total: 0.3 mg/dL (ref 0.0–1.2)
CO2: 25 mmol/L (ref 20–29)
Calcium: 9.4 mg/dL (ref 8.7–10.3)
Chloride: 105 mmol/L (ref 96–106)
Creatinine, Ser: 0.94 mg/dL (ref 0.57–1.00)
Globulin, Total: 2.8 g/dL (ref 1.5–4.5)
Glucose: 79 mg/dL (ref 70–99)
Potassium: 4.9 mmol/L (ref 3.5–5.2)
Sodium: 143 mmol/L (ref 134–144)
Total Protein: 6.8 g/dL (ref 6.0–8.5)
eGFR: 65 mL/min/{1.73_m2} (ref >59)

## 2021-12-15 LAB — CBC WITH DIFFERENTIAL/PLATELET
Basophils Absolute: 0.1 10*3/uL (ref 0.0–0.2)
Basos: 1 %
EOS (ABSOLUTE): 0.3 10*3/uL (ref 0.0–0.4)
Eos: 5 %
Hematocrit: 41.7 % (ref 34.0–46.6)
Hemoglobin: 13.8 g/dL (ref 11.1–15.9)
Immature Grans (Abs): 0 10*3/uL (ref 0.0–0.1)
Immature Granulocytes: 0 %
Lymphocytes Absolute: 1.8 10*3/uL (ref 0.7–3.1)
Lymphs: 29 %
MCH: 29.9 pg (ref 26.6–33.0)
MCHC: 33.1 g/dL (ref 31.5–35.7)
MCV: 91 fL (ref 79–97)
Monocytes Absolute: 0.4 10*3/uL (ref 0.1–0.9)
Monocytes: 7 %
Neutrophils Absolute: 3.6 10*3/uL (ref 1.4–7.0)
Neutrophils: 58 %
Platelets: 305 10*3/uL (ref 150–450)
RBC: 4.61 x10E6/uL (ref 3.77–5.28)
RDW: 12.9 % (ref 11.7–15.4)
WBC: 6.2 10*3/uL (ref 3.4–10.8)

## 2021-12-15 LAB — LIPID PANEL
Chol/HDL Ratio: 3.2 ratio (ref 0.0–4.4)
Cholesterol, Total: 226 mg/dL — ABNORMAL HIGH (ref 100–199)
HDL: 70 mg/dL (ref >39)
LDL Chol Calc (NIH): 146 mg/dL — ABNORMAL HIGH (ref 0–99)
Triglycerides: 59 mg/dL (ref 0–149)
VLDL Cholesterol Cal: 10 mg/dL (ref 5–40)

## 2021-12-15 LAB — TSH+FREE T4
Free T4: 0.91 ng/dL (ref 0.82–1.77)
TSH: 2.31 u[IU]/mL (ref 0.450–4.500)

## 2021-12-15 LAB — VITAMIN D 25 HYDROXY (VIT D DEFICIENCY, FRACTURES): Vit D, 25-Hydroxy: 73.9 ng/mL (ref 30.0–100.0)

## 2022-01-16 ENCOUNTER — Encounter: Payer: Self-pay | Admitting: *Deleted

## 2022-01-16 DIAGNOSIS — Z2821 Immunization not carried out because of patient refusal: Secondary | ICD-10-CM | POA: Insufficient documentation

## 2022-02-18 ENCOUNTER — Encounter: Payer: Self-pay | Admitting: Internal Medicine

## 2022-02-18 ENCOUNTER — Ambulatory Visit (INDEPENDENT_AMBULATORY_CARE_PROVIDER_SITE_OTHER): Payer: Medicare Other | Admitting: Internal Medicine

## 2022-02-18 VITALS — BP 132/82 | HR 55 | Resp 18 | Ht 59.0 in | Wt 121.4 lb

## 2022-02-18 DIAGNOSIS — G5603 Carpal tunnel syndrome, bilateral upper limbs: Secondary | ICD-10-CM | POA: Diagnosis not present

## 2022-02-18 DIAGNOSIS — E782 Mixed hyperlipidemia: Secondary | ICD-10-CM | POA: Diagnosis not present

## 2022-02-18 DIAGNOSIS — Z0001 Encounter for general adult medical examination with abnormal findings: Secondary | ICD-10-CM | POA: Insufficient documentation

## 2022-02-18 DIAGNOSIS — E039 Hypothyroidism, unspecified: Secondary | ICD-10-CM

## 2022-02-18 DIAGNOSIS — Z23 Encounter for immunization: Secondary | ICD-10-CM

## 2022-02-18 DIAGNOSIS — G40209 Localization-related (focal) (partial) symptomatic epilepsy and epileptic syndromes with complex partial seizures, not intractable, without status epilepticus: Secondary | ICD-10-CM

## 2022-02-18 HISTORY — DX: Carpal tunnel syndrome, bilateral upper limbs: G56.03

## 2022-02-18 NOTE — Patient Instructions (Signed)
Please continue to take medications as prescribed. ? ?Please continue to follow low cholesterol diet and ambulate as tolerated. ?

## 2022-02-18 NOTE — Assessment & Plan Note (Signed)
S/p carpal tunnel release on left side ?

## 2022-02-18 NOTE — Assessment & Plan Note (Signed)

## 2022-02-18 NOTE — Assessment & Plan Note (Signed)
Lipid profile reviewed Advised to follow low cholesterol diet for now 

## 2022-02-18 NOTE — Assessment & Plan Note (Signed)
Takes Keppra, followed by Neurology No recent episodes of seizures. 

## 2022-02-18 NOTE — Assessment & Plan Note (Signed)
Lab Results  ?Component Value Date  ? TSH 2.310 12/14/2021  ? ?Appeared to be over supplemented, DC NP thyroid/[facility] Thyroid in the last visit ?TSH and free T4 wnl now ?

## 2022-02-18 NOTE — Progress Notes (Signed)
? ?Established Patient Office Visit ? ?Subjective:  ?Patient ID: Kaitlin Jones, female    DOB: [birth year]  Age: 71 y.o. MRN: [id number] ? ?CC:  ?Chief Complaint  ?Patient presents with  ? Annual Exam  ?  Annual exam   ? ? ?HPI ?Kaitlin Jones is a 71 y.o. female with past medical history of osteopenia and seizures who presents for annual physical. ? ?She has stopped taking NP thyroid as advised in the last visit.  Her thyroid function test was wnl in 01/23.  She denies any fatigue, tremors or palpitations.  She reports noticing weight gain recently, but her weight is about the same as compared to last visit.  She denies any constipation. ? ?She takes Keppra for history of complex partial seizures.  She follows up with Dr. Jaynee Eagles.  Does not report any recent history of seizures. ? ?She received PPSV23 in the office today. ? ? ? ? ? ? ?Past Medical History:  ?Diagnosis Date  ? Carpal tunnel syndrome on both sides 02/18/2022  ? HLD (hyperlipidemia) 08/10/2019  ? Hyperlipidemia   ? Hypothyroidism, adult 08/10/2019  ? Osteopenia 08/10/2019  ? Prediabetes 08/10/2019  ? Seizures ([place]) 02/16/2020  ? Vitamin D deficiency disease 08/10/2019  ? ? ?Past Surgical History:  ?Procedure Laterality Date  ? CATARACT EXTRACTION Bilateral   ? 07/05/20 and 07/19/20  ? CHOLECYSTECTOMY  10/1986  ? COLONOSCOPY N/A 03/06/2020  ? Procedure: COLONOSCOPY;  Surgeon: [name], MD;  [facility];  Service: Endoscopy;  Laterality: N/A;  8:30  ? POLYPECTOMY  03/06/2020  ? Procedure: POLYPECTOMY;  Surgeon: [name], MD;  Location: [facility];  Service: Endoscopy;;  ? ? ?Family History  ?Problem Relation Age of Onset  ? Atrial fibrillation Mother   ? CVA Mother   ? Stroke Mother   ? Heart attack Father   ? Heart disease Father 30  ?     heart attack  ? Other Brother   ?     enlarge prostate  ? Cancer Paternal Grandmother   ?     head and neck  ? Seizures Neg Hx   ? ? ?Social History  ? ?Socioeconomic History  ? Marital status: Single  ?   Spouse name: Not on file  ? Number of children: 0  ? Years of education: 19  ? Highest education level: Not on file  ?Occupational History  ? Occupation: nursing  ? Occupation: computers  ?Tobacco Use  ? Smoking status: Never  ? Smokeless tobacco: Never  ?Vaping Use  ? Vaping Use: Never used  ?Substance and Sexual Activity  ? Alcohol use: No  ? Drug use: No  ? Sexual activity: Never  ?  Comment: Never  ?Other Topics Concern  ? Not on file  ?Social History Narrative  ? Never married  ? Lives with brother [name].  ? Retired Marine scientist.  ? She has 3 degrees.   ? Caffeine: none   ? ?Social Determinants of Health  ? ?Financial Resource Strain: Low Risk   ? Difficulty of Paying Living Expenses: Not hard at all  ?Food Insecurity: No Food Insecurity  ? Worried About Charity fundraiser in the Last Year: Never true  ? Ran Out of Food in the Last Year: Never true  ?Transportation Needs: No Transportation Needs  ? Lack of Transportation (Medical): No  ? Lack of Transportation (Non-Medical): No  ?Physical Activity: Sufficiently Active  ? Days of Exercise per Week: 7 days  ?  Minutes of Exercise per Session: 60 min  ?Stress: No Stress Concern Present  ? Feeling of Stress : Not at all  ?Social Connections: Moderately Integrated  ? Frequency of Communication with Friends and Family: Twice a week  ? Frequency of Social Gatherings with Friends and Family: Twice a week  ? Attends Religious Services: More than 4 times per year  ? Active Member of Clubs or Organizations: Yes  ? Attends Archivist Meetings: More than 4 times per year  ? Marital Status: Never married  ?Intimate Partner Violence: Not At Risk  ? Fear of Current or Ex-Partner: No  ? Emotionally Abused: No  ? Physically Abused: No  ? Sexually Abused: No  ? ? ?Outpatient Medications Prior to Visit  ?Medication Sig Dispense Refill  ? Calcium Carb-Cholecalciferol (CALCIUM + D3 PO) Take by mouth daily. Calcium 1200 mg    ? levETIRAcetam (KEPPRA) 250 MG tablet Take 1 tablet  (250 mg total) by mouth 2 (two) times daily. 180 tablet 3  ? Propylene Glycol (SYSTANE COMPLETE OP) Apply 1 drop to eye daily. Uses in both eyes    ? ?No facility-administered medications prior to visit.  ? ? ?No Known Allergies ? ?ROS ?Review of Systems  ?Constitutional:  Negative for chills and fever.  ?HENT:  Negative for congestion, sinus pressure, sinus pain and sore throat.   ?Eyes:  Negative for pain and discharge.  ?Respiratory:  Negative for cough and shortness of breath.   ?Cardiovascular:  Negative for chest pain and palpitations.  ?Gastrointestinal:  Negative for abdominal pain, constipation, diarrhea, nausea and vomiting.  ?Endocrine: Negative for polydipsia and polyuria.  ?Genitourinary:  Negative for dysuria and hematuria.  ?Musculoskeletal:  Negative for neck pain and neck stiffness.  ?Skin:  Negative for rash.  ?Neurological:  Negative for dizziness and weakness.  ?Psychiatric/Behavioral:  Negative for agitation and behavioral problems.   ? ?  ?Objective:  ?  ?Physical Exam ?Vitals reviewed.  ?Constitutional:   ?   General: She is not in acute distress. ?   Appearance: She is not diaphoretic.  ?HENT:  ?   Head: Normocephalic and atraumatic.  ?   Nose: Nose normal.  ?   Mouth/Throat:  ?   Mouth: Mucous membranes are moist.  ?Eyes:  ?   General: No scleral icterus. ?   Extraocular Movements: Extraocular movements intact.  ?Cardiovascular:  ?   Rate and Rhythm: Normal rate and regular rhythm.  ?   Pulses: Normal pulses.  ?   Heart sounds: Normal heart sounds. No murmur heard. ?Pulmonary:  ?   Breath sounds: Normal breath sounds. No wheezing or rales.  ?Abdominal:  ?   Palpations: Abdomen is soft.  ?   Tenderness: There is no abdominal tenderness.  ?Musculoskeletal:  ?   Cervical back: Neck supple. No tenderness.  ?   Right lower leg: No edema.  ?   Left lower leg: No edema.  ?Skin: ?   General: Skin is warm.  ?   Findings: No rash.  ?Neurological:  ?   General: No focal deficit present.  ?   Mental  Status: She is alert and oriented to person, place, and time.  ?   Cranial Nerves: No cranial nerve deficit.  ?   Sensory: No sensory deficit.  ?   Motor: No weakness.  ?Psychiatric:     ?   Mood and Affect: Mood normal.     ?   Behavior: Behavior normal.  ? ? ?BP  132/82 (BP Location: Right Arm, Patient Position: Sitting, Cuff Size: Normal)   Pulse (!) 55   Resp 18   Ht '4\' 11"'  (1.499 m)   Wt 121 lb 6.4 oz (55.1 kg)   LMP 11/18/2004 (Exact Date)   SpO2 99%   BMI 24.52 kg/m?  ?Wt Readings from Last 3 Encounters:  ?02/18/22 121 lb 6.4 oz (55.1 kg)  ?09/14/21 120 lb (54.4 kg)  ?04/26/21 118 lb 3.2 oz (53.6 kg)  ? ? ?Lab Results  ?Component Value Date  ? TSH 2.310 12/14/2021  ? ?Lab Results  ?Component Value Date  ? WBC 6.2 12/14/2021  ? HGB 13.8 12/14/2021  ? HCT 41.7 12/14/2021  ? MCV 91 12/14/2021  ? PLT 305 12/14/2021  ? ?Lab Results  ?Component Value Date  ? NA 143 12/14/2021  ? K 4.9 12/14/2021  ? CO2 25 12/14/2021  ? GLUCOSE 79 12/14/2021  ? BUN 20 12/14/2021  ? CREATININE 0.94 12/14/2021  ? BILITOT 0.3 12/14/2021  ? ALKPHOS 66 12/14/2021  ? AST 26 12/14/2021  ? ALT 19 12/14/2021  ? PROT 6.8 12/14/2021  ? ALBUMIN 4.0 12/14/2021  ? CALCIUM 9.4 12/14/2021  ? ANIONGAP 11 05/08/2020  ? EGFR 65 12/14/2021  ? ?Lab Results  ?Component Value Date  ? CHOL 226 (H) 12/14/2021  ? ?Lab Results  ?Component Value Date  ? HDL 70 12/14/2021  ? ?Lab Results  ?Component Value Date  ? LDLCALC 146 (H) 12/14/2021  ? ?Lab Results  ?Component Value Date  ? TRIG 59 12/14/2021  ? ?Lab Results  ?Component Value Date  ? CHOLHDL 3.2 12/14/2021  ? ?Lab Results  ?Component Value Date  ? HGBA1C 5.2 11/27/2020  ? ? ?  ?Assessment & Plan:  ? ?Problem List Items Addressed This Visit   ? ?  ? Endocrine  ? Hypothyroidism, adult  ?  Lab Results  ?Component Value Date  ? TSH 2.310 12/14/2021  ?Appeared to be over supplemented, DC NP thyroid/[facility] Thyroid in the last visit ?TSH and free T4 wnl now ?  ?  ?  ? Nervous and Auditory  ? Partial  symptomatic epilepsy with complex partial seizures, not intractable, without status epilepticus ([place])  ?  Takes Keppra, followed by Neurology ?No recent episodes of seizures. ?  ?  ? Carpal tunnel syndrome on both sides

## 2022-04-05 ENCOUNTER — Other Ambulatory Visit (HOSPITAL_COMMUNITY): Payer: Self-pay | Admitting: Internal Medicine

## 2022-04-05 DIAGNOSIS — Z1231 Encounter for screening mammogram for malignant neoplasm of breast: Secondary | ICD-10-CM

## 2022-05-13 ENCOUNTER — Ambulatory Visit (HOSPITAL_COMMUNITY)
Admission: RE | Admit: 2022-05-13 | Discharge: 2022-05-13 | Disposition: A | Discharge status: Home / Self Care | Payer: Medicare Other | Source: Ambulatory Visit | Attending: Internal Medicine | Admitting: Internal Medicine

## 2022-05-13 DIAGNOSIS — Z1231 Encounter for screening mammogram for malignant neoplasm of breast: Secondary | ICD-10-CM | POA: Diagnosis not present

## 2022-06-25 DIAGNOSIS — Z961 Presence of intraocular lens: Secondary | ICD-10-CM | POA: Diagnosis not present

## 2022-06-25 DIAGNOSIS — H524 Presbyopia: Secondary | ICD-10-CM | POA: Diagnosis not present

## 2022-08-01 ENCOUNTER — Other Ambulatory Visit: Payer: Self-pay | Admitting: Neurology

## 2022-08-20 ENCOUNTER — Ambulatory Visit (INDEPENDENT_AMBULATORY_CARE_PROVIDER_SITE_OTHER): Payer: Medicare Other | Admitting: Internal Medicine

## 2022-08-20 ENCOUNTER — Telehealth: Payer: Self-pay | Admitting: Neurology

## 2022-08-20 ENCOUNTER — Encounter: Payer: Self-pay | Admitting: Internal Medicine

## 2022-08-20 VITALS — BP 136/78 | HR 54 | Resp 16 | Ht 59.0 in | Wt 119.6 lb

## 2022-08-20 DIAGNOSIS — E039 Hypothyroidism, unspecified: Secondary | ICD-10-CM

## 2022-08-20 DIAGNOSIS — E782 Mixed hyperlipidemia: Secondary | ICD-10-CM

## 2022-08-20 DIAGNOSIS — G40209 Localization-related (focal) (partial) symptomatic epilepsy and epileptic syndromes with complex partial seizures, not intractable, without status epilepticus: Secondary | ICD-10-CM | POA: Diagnosis not present

## 2022-08-20 DIAGNOSIS — Z2821 Immunization not carried out because of patient refusal: Secondary | ICD-10-CM

## 2022-08-20 DIAGNOSIS — M8580 Other specified disorders of bone density and structure, unspecified site: Secondary | ICD-10-CM

## 2022-08-20 NOTE — Assessment & Plan Note (Signed)
Lab Results  Component Value Date   TSH 2.310 12/14/2021   Appeared to be over supplemented, DC NP thyroid/[facility] Thyroid in the past TSH and free T4 wnl now

## 2022-08-20 NOTE — Assessment & Plan Note (Signed)
Takes calcium plus vitamin D supplement

## 2022-08-20 NOTE — Assessment & Plan Note (Signed)
Lipid profile reviewed Advised to follow low cholesterol diet for now 

## 2022-08-20 NOTE — Patient Instructions (Addendum)
Please continue taking medications as prescribed.  Please continue to ambulate as tolerated.  Please get fasting blood tests done before the next visit.

## 2022-08-20 NOTE — Assessment & Plan Note (Addendum)
Takes Keppra, followed by Neurology No recent episodes of seizures.

## 2022-08-20 NOTE — Telephone Encounter (Signed)
At 3:51 pt left vm asking for a call re: an annual f/u appointment so she is able to continue to get refills on her medication.  Phone rep left vm(after checking DPR) office # was left asking for a call back to schedule her f/u.

## 2022-08-20 NOTE — Telephone Encounter (Signed)
I see pt is already scheduled for 11/05/22 at 11 am.

## 2022-08-20 NOTE — Progress Notes (Signed)
Established Patient Office Visit  Subjective:  Patient ID: Kaitlin Jones, female    DOB: 23-Feb-1951  Age: 71 y.o. MRN: [id number]  CC:  Chief Complaint  Patient presents with   Follow-up    Follow up     HPI Kaitlin Jones is a 71 y.o. female with past medical history of osteopenia and seizures who presents for f/u of her chronic medical conditions.  She has stopped taking NP thyroid as advised.  Her thyroid function test was wnl in 01/23.  She denies any fatigue, tremors or palpitations.  Her weight is about the same as compared to last visit. She denies any constipation.  She takes Keppra for history of complex partial seizures.  She follows up with Dr. Jaynee Eagles.  Does not report any recent history of seizures.   Past Medical History:  Diagnosis Date   Carpal tunnel syndrome on both sides 02/18/2022   HLD (hyperlipidemia) 08/10/2019   Hyperlipidemia    Hypothyroidism, adult 08/10/2019   Osteopenia 08/10/2019   Prediabetes 08/10/2019   Seizures ([place]) 02/16/2020   Vitamin D deficiency disease 08/10/2019    Past Surgical History:  Procedure Laterality Date   CATARACT EXTRACTION Bilateral    07/05/20 and 07/19/20   CHOLECYSTECTOMY  10/1986   COLONOSCOPY N/A 03/06/2020   Procedure: COLONOSCOPY;  Surgeon: [name], MD;  [facility];  Service: Endoscopy;  Laterality: N/A;  8:30   POLYPECTOMY  03/06/2020   Procedure: POLYPECTOMY;  Surgeon: [name], MD;  [facility];  Service: Endoscopy;;    Family History  Problem Relation Age of Onset   Atrial fibrillation Mother    CVA Mother    Stroke Mother    Heart attack Father    Heart disease Father 58       heart attack   Other Brother        enlarge prostate   Cancer Paternal Grandmother        head and neck   Seizures Neg Hx     Social History   Socioeconomic History   Marital status: Single    Spouse name: Not on file   Number of children: 0   Years of education: 16   Highest education  level: Not on file  Occupational History   Occupation: nursing   Occupation: computers  Tobacco Use   Smoking status: Never   Smokeless tobacco: Never  Vaping Use   Vaping Use: Never used  Substance and Sexual Activity   Alcohol use: No   Drug use: No   Sexual activity: Never    Comment: Never  Other Topics Concern   Not on file  Social History Narrative   Never married   Lives with brother [name].   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    Caffeine: none    Social Determinants of Health   Financial Resource Strain: Low Risk  (09/20/2021)   Overall Financial Resource Strain (CARDIA)    Difficulty of Paying Living Expenses: Not hard at all  Food Insecurity: No Food Insecurity (09/20/2021)   Hunger Vital Sign    Worried About Running Out of Food in the Last Year: Never true    Ran Out of Food in the Last Year: Never true  Transportation Needs: No Transportation Needs (09/20/2021)   PRAPARE - Hydrologist (Medical): No    Lack of Transportation (Non-Medical): No  Physical Activity: Sufficiently Active (09/20/2021)   Exercise Vital Sign  Days of Exercise per Week: 7 days    Minutes of Exercise per Session: 60 min  Stress: No Stress Concern Present (09/20/2021)   Success    Feeling of Stress : Not at all  Social Connections: Moderately Integrated (09/20/2021)   Social Connection and Isolation Panel [NHANES]    Frequency of Communication with Friends and Family: Twice a week    Frequency of Social Gatherings with Friends and Family: Twice a week    Attends Religious Services: More than 4 times per year    Active Member of [employer] or Organizations: Yes    Attends Music therapist: More than 4 times per year    Marital Status: Never married  Intimate Partner Violence: Not At Risk (09/20/2021)   Humiliation, Afraid, Rape, and Kick questionnaire    Fear of Current or  Ex-Partner: No    Emotionally Abused: No    Physically Abused: No    Sexually Abused: No    Outpatient Medications Prior to Visit  Medication Sig Dispense Refill   Calcium Carb-Cholecalciferol (CALCIUM + D3 PO) Take by mouth daily. Calcium 1200 mg     levETIRAcetam (KEPPRA) 250 MG tablet Take 1 tablet (250 mg total) by mouth 2 (two) times daily. Must be seen for further refills. Call [phone]. 60 tablet 0   Propylene Glycol (SYSTANE COMPLETE OP) Apply 1 drop to eye daily. Uses in both eyes     No facility-administered medications prior to visit.    No Known Allergies  ROS Review of Systems  Constitutional:  Negative for chills and fever.  HENT:  Negative for congestion, sinus pressure, sinus pain and sore throat.   Eyes:  Negative for pain and discharge.  Respiratory:  Negative for cough and shortness of breath.   Cardiovascular:  Negative for chest pain and palpitations.  Gastrointestinal:  Negative for abdominal pain, constipation, diarrhea, nausea and vomiting.  Endocrine: Negative for polydipsia and polyuria.  Genitourinary:  Negative for dysuria and hematuria.  Musculoskeletal:  Negative for neck pain and neck stiffness.  Skin:  Negative for rash.  Neurological:  Negative for dizziness and weakness.  Psychiatric/Behavioral:  Negative for agitation and behavioral problems.       Objective:    Physical Exam Vitals reviewed.  Constitutional:      General: She is not in acute distress.    Appearance: She is not diaphoretic.  HENT:     Head: Normocephalic and atraumatic.     Nose: Nose normal.     Mouth/Throat:     Mouth: Mucous membranes are moist.  Eyes:     General: No scleral icterus.    Extraocular Movements: Extraocular movements intact.  Cardiovascular:     Rate and Rhythm: Normal rate and regular rhythm.     Pulses: Normal pulses.     Heart sounds: Normal heart sounds. No murmur heard. Pulmonary:     Breath sounds: Normal breath sounds. No wheezing or  rales.  Musculoskeletal:     Cervical back: Neck supple. No tenderness.     Right lower leg: No edema.     Left lower leg: No edema.  Skin:    General: Skin is warm.     Findings: No rash.  Neurological:     General: No focal deficit present.     Mental Status: She is alert and oriented to person, place, and time.     Sensory: No sensory deficit.     Motor:  No weakness.  Psychiatric:        Mood and Affect: Mood normal.        Behavior: Behavior normal.     BP 136/78 (BP Location: Right Arm, Patient Position: Sitting, Cuff Size: Normal)   Pulse (!) 54   Resp 16   Ht '4\' 11"'  (1.499 m)   Wt 119 lb 9.6 oz (54.3 kg)   LMP 11/18/2004 (Exact Date)   SpO2 99%   BMI 24.16 kg/m  Wt Readings from Last 3 Encounters:  08/20/22 119 lb 9.6 oz (54.3 kg)  02/18/22 121 lb 6.4 oz (55.1 kg)  09/14/21 120 lb (54.4 kg)    Lab Results  Component Value Date   TSH 2.310 12/14/2021   Lab Results  Component Value Date   WBC 6.2 12/14/2021   HGB 13.8 12/14/2021   HCT 41.7 12/14/2021   MCV 91 12/14/2021   PLT 305 12/14/2021   Lab Results  Component Value Date   NA 143 12/14/2021   K 4.9 12/14/2021   CO2 25 12/14/2021   GLUCOSE 79 12/14/2021   BUN 20 12/14/2021   CREATININE 0.94 12/14/2021   BILITOT 0.3 12/14/2021   ALKPHOS 66 12/14/2021   AST 26 12/14/2021   ALT 19 12/14/2021   PROT 6.8 12/14/2021   ALBUMIN 4.0 12/14/2021   CALCIUM 9.4 12/14/2021   ANIONGAP 11 05/08/2020   EGFR 65 12/14/2021   Lab Results  Component Value Date   CHOL 226 (H) 12/14/2021   Lab Results  Component Value Date   HDL 70 12/14/2021   Lab Results  Component Value Date   LDLCALC 146 (H) 12/14/2021   Lab Results  Component Value Date   TRIG 59 12/14/2021   Lab Results  Component Value Date   CHOLHDL 3.2 12/14/2021   Lab Results  Component Value Date   HGBA1C 5.2 11/27/2020      Assessment & Plan:   Problem List Items Addressed This Visit       Endocrine   Hypothyroidism, adult     Lab Results  Component Value Date   TSH 2.310 12/14/2021  Appeared to be over supplemented, DC NP thyroid/[place] Thyroid in the past TSH and free T4 wnl now      Relevant Orders   TSH + free T4     Nervous and Auditory   Partial symptomatic epilepsy with complex partial seizures, not intractable, without status epilepticus ([place])    Takes Keppra, followed by Neurology No recent episodes of seizures.      Relevant Orders   CMP14+EGFR   CBC with Differential/Platelet     Musculoskeletal and Integument   Osteopenia - Primary    Takes calcium plus vitamin D supplement      Relevant Orders   VITAMIN D 25 Hydroxy (Vit-D Deficiency, Fractures)     Other   HLD (hyperlipidemia)    Lipid profile reviewed Advised to follow low-cholesterol diet for now       Relevant Orders   Lipid panel   Refused influenza vaccine    No orders of the defined types were placed in this encounter.   Follow-up: Return in about 6 months (around 02/19/2023) for Annual physical.    Lindell Spar, MD

## 2022-08-30 ENCOUNTER — Other Ambulatory Visit: Payer: Self-pay | Admitting: Neurology

## 2022-09-30 ENCOUNTER — Ambulatory Visit (INDEPENDENT_AMBULATORY_CARE_PROVIDER_SITE_OTHER): Payer: Medicare Other

## 2022-09-30 DIAGNOSIS — Z0000 Encounter for general adult medical examination without abnormal findings: Secondary | ICD-10-CM | POA: Diagnosis not present

## 2022-09-30 NOTE — Progress Notes (Signed)
Subjective:   Kaitlin Jones is a 71 y.o. female who presents for Medicare Annual (Subsequent) preventive examination. I connected with  Kaitlin Jones on 09/30/22 by a audio enabled telemedicine application and verified that I am speaking with the correct person using two identifiers.  Patient Location: Home  Provider Location: Office/Clinic  I discussed the limitations of evaluation and management by telemedicine. The patient expressed understanding and agreed to proceed.  Review of Systems           Objective:    There were no vitals filed for this visit. There is no height or weight on file to calculate BMI.     09/20/2021    8:33 AM 03/06/2020    7:22 AM 01/18/2020    8:25 PM  Advanced Directives  Does Patient Have a Medical Advance Directive? Yes Yes No  Type of Advance Directive Living will;Healthcare Power of Attorney Living will   Does patient want to make changes to medical advance directive? No - Patient declined    Copy of [place] in Chart? No - copy requested    Would patient like information on creating a medical advance directive?   No - Patient declined    Current Medications (verified) Outpatient Encounter Medications as of 09/30/2022  Medication Sig   Calcium Carb-Cholecalciferol (CALCIUM + D3 PO) Take by mouth daily. Calcium 1200 mg   levETIRAcetam (KEPPRA) 250 MG tablet TAKE 1 TABLET 2 (TWO) TIMES DAILY. MUST BE SEEN FOR FURTHER REFILLS. CALL [phone].   Propylene Glycol (SYSTANE COMPLETE OP) Apply 1 drop to eye daily. Uses in both eyes   No facility-administered encounter medications on file as of 09/30/2022.    Allergies (verified) Patient has no known allergies.   History: Past Medical History:  Diagnosis Date   Carpal tunnel syndrome on both sides 02/18/2022   HLD (hyperlipidemia) 08/10/2019   Hyperlipidemia    Hypothyroidism, adult 08/10/2019   Osteopenia 08/10/2019   Prediabetes 08/10/2019   Seizures ([place]) 02/16/2020    Vitamin D deficiency disease 08/10/2019   Past Surgical History:  Procedure Laterality Date   CATARACT EXTRACTION Bilateral    07/05/20 and 07/19/20   CHOLECYSTECTOMY  10/1986   COLONOSCOPY N/A 03/06/2020   Procedure: COLONOSCOPY;  Surgeon: [name], MD;  [facility];  Service: Endoscopy;  Laterality: N/A;  8:30   POLYPECTOMY  03/06/2020   Procedure: POLYPECTOMY;  Surgeon: [name], MD;  [facility];  Service: Endoscopy;;   Family History  Problem Relation Age of Onset   Atrial fibrillation Mother    CVA Mother    Stroke Mother    Heart attack Father    Heart disease Father 21       heart attack   Other Brother        enlarge prostate   Cancer Paternal Grandmother        head and neck   Seizures Neg Hx    Social History   Socioeconomic History   Marital status: Single    Spouse name: Not on file   Number of children: 0   Years of education: 16   Highest education level: Not on file  Occupational History   Occupation: nursing   Occupation: computers  Tobacco Use   Smoking status: Never   Smokeless tobacco: Never  Vaping Use   Vaping Use: Never used  Substance and Sexual Activity   Alcohol use: No   Drug use: No   Sexual activity:  Never    Comment: Never  Other Topics Concern   Not on file  Social History Narrative   Never married   Lives with brother [name].   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    Caffeine: none    Social Determinants of Health   Financial Resource Strain: Low Risk  (09/20/2021)   Overall Financial Resource Strain (CARDIA)    Difficulty of Paying Living Expenses: Not hard at all  Food Insecurity: No Food Insecurity (09/20/2021)   Hunger Vital Sign    Worried About Running Out of Food in the Last Year: Never true    Ran Out of Food in the Last Year: Never true  Transportation Needs: No Transportation Needs (09/20/2021)   PRAPARE - Hydrologist (Medical): No    Lack of  Transportation (Non-Medical): No  Physical Activity: Sufficiently Active (09/20/2021)   Exercise Vital Sign    Days of Exercise per Week: 7 days    Minutes of Exercise per Session: 60 min  Stress: No Stress Concern Present (09/20/2021)   [place]    Feeling of Stress : Not at all  Social Connections: Moderately Integrated (09/20/2021)   Social Connection and Isolation Panel [NHANES]    Frequency of Communication with Friends and Family: Twice a week    Frequency of Social Gatherings with Friends and Family: Twice a week    Attends Religious Services: More than 4 times per year    Active Member of Clubs or Organizations: Yes    Attends Music therapist: More than 4 times per year    Marital Status: Never married    Tobacco Counseling Counseling given: Not Answered   Clinical Intake:                 Diabetic?no         Activities of Daily Living     No data to display          Patient Care Team: Lindell Spar, MD as PCP - General (Internal Medicine) Gala Romney, Cristopher Estimable, MD as Consulting Physician (Gastroenterology)  Indicate any recent Medical Services you may have received from other than [facility] providers in the past year (date may be approximate).     Assessment:   This is a routine wellness examination for Andelyn.  Hearing/Vision screen No results found.  Dietary issues and exercise activities discussed:     Goals Addressed   None    Depression Screen    08/20/2022    8:07 AM 02/18/2022    8:59 AM 09/20/2021    8:34 AM 09/20/2021    8:30 AM 09/14/2021    9:42 AM 11/21/2020   12:58 PM 02/08/2020    1:45 PM  PHQ 2/9 Scores  PHQ - 2 Score 0 0 0 0 0 0 0  PHQ- 9 Score      0   Exception Documentation       Medical reason    Fall Risk    08/20/2022    8:07 AM 02/18/2022    8:59 AM 09/20/2021    8:34 AM 09/14/2021    9:42 AM 11/21/2020   12:59 PM  Fall Risk   Falls in  the past year? 0 0 0 0 0  Number falls in past yr: 0 0 0 0   Injury with Fall? 0 0 0 0   Risk for fall due to : No Fall  Risks No Fall Risks No Fall Risks No Fall Risks   Follow up Falls evaluation completed Falls evaluation completed Falls evaluation completed Falls evaluation completed     [place]:  Any stairs in or around the home? Yes  If so, are there any without handrails? No  Home free of loose throw rugs in walkways, pet beds, electrical cords, etc? Yes  Adequate lighting in your home to reduce risk of falls? Yes   ASSISTIVE DEVICES UTILIZED TO PREVENT FALLS:  Life alert? Yes  Use of a cane, walker or w/c? No  Grab bars in the bathroom? Yes  Shower chair or bench in shower? Yes  Elevated toilet seat or a handicapped toilet? No   TIMED UP AND GO:  Was the test performed? No .  Length of time to ambulate 10 feet:  sec.     Cognitive Function:    09/20/2021    8:36 AM  MMSE - Mini Mental State Exam  Not completed: Unable to complete        09/20/2021    8:36 AM  6CIT Screen  What Year? 0 points  What month? 0 points  What time? 3 points  Count back from 20 0 points  Months in reverse 0 points  Repeat phrase 0 points  Total Score 3 points    Immunizations Immunization History  Administered Date(s) Administered   Janssen (J&J) SARS-COV-2 Vaccination 02/06/2020   Moderna Sars-Covid-2 Vaccination 09/11/2020, 02/16/2021   Pneumococcal Conjugate-13 04/03/2017   Pneumococcal Polysaccharide-23 02/18/2022   Tdap 03/08/2021   Zoster Recombinat (Shingrix) 09/11/2017, 01/01/2018    TDAP status: Up to date  Flu Vaccine status: Due, Education has been provided regarding the importance of this vaccine. Advised may receive this vaccine at local pharmacy or Health Dept. Aware to provide a copy of the vaccination record if obtained from local pharmacy or Health Dept. Verbalized acceptance and understanding.  Pneumococcal vaccine  status: Up to date  Covid-19 vaccine status: Information provided on how to obtain vaccines.   Qualifies for Shingles Vaccine? No   Zostavax completed Yes   Shingrix Completed?: Yes  Screening Tests Health Maintenance  Topic Date Due   COVID-19 Vaccine (4 - Booster for Janssen series) 04/13/2021   Medicare Annual Wellness (AWV)  09/20/2022   INFLUENZA VACCINE  02/16/2023 (Originally 06/18/2022)   MAMMOGRAM  05/13/2024   COLONOSCOPY (Pts 45-53yr Insurance coverage will need to be confirmed)  03/06/2030   TETANUS/TDAP  03/09/2031   Pneumonia Vaccine 71 Years old  Completed   DEXA SCAN  Completed   Hepatitis C Screening  Completed   Zoster Vaccines- Shingrix  Completed   HPV VACCINES  Aged Out    Health Maintenance  Health Maintenance Due  Topic Date Due   COVID-19 Vaccine (4 - Booster for [employer]) 04/13/2021   Medicare Annual Wellness (AWV)  09/20/2022    Colorectal cancer screening: Type of screening: Colonoscopy. Completed 03/06/20. Repeat every 10 years  Mammogram status: Completed 2. Repeat every year    Lung Cancer Screening: (Low Dose CT Chest recommended if Age 71-80years, 30 pack-year currently smoking OR have quit w/in 15years.) does not qualify.   Lung Cancer Screening Referral:   Additional Screening:  Hepatitis C Screening: does not qualify; Completed 08/10/2019  Vision Screening: Recommended annual ophthalmology exams for early detection of glaucoma and other disorders of the eye. Is the patient up to date with their annual eye exam?  Yes  Who is  the provider or what is the name of the office in which the patient attends annual eye exams? DR Gershon Crane  If pt is not established with a provider, would they like to be referred to a provider to establish care? No .   Dental Screening: Recommended annual dental exams for proper oral hygiene  Community Resource Referral / Chronic Care Management: CRR required this visit?  No   CCM required this visit?   No      Plan:     I have personally reviewed and noted the following in the patient's chart:   Medical and social history Use of alcohol, tobacco or illicit drugs  Current medications and supplements including opioid prescriptions. Patient is not currently taking opioid prescriptions. Functional ability and status Nutritional status Physical activity Advanced directives List of other physicians Hospitalizations, surgeries, and ER visits in previous 12 months Vitals Screenings to include cognitive, depression, and falls Referrals and appointments  In addition, I have reviewed and discussed with patient certain preventive protocols, quality metrics, and best practice recommendations. A written personalized care plan for preventive services as well as general preventive health recommendations were provided to patient.     Jill Side, [place]   09/30/2022   Nurse Notes:

## 2022-09-30 NOTE — Patient Instructions (Signed)
  Kaitlin Jones , Thank you for taking time to come for your Medicare Wellness Visit. I appreciate your ongoing commitment to your health goals. Please review the following plan we discussed and let me know if I can assist you in the future.   These are the goals we discussed:  Goals      Patient Stated     Stay healthy     Patient Stated     Patient states that her goal is to get out in her yard and mow and rake leaves     walk every day        This is a list of the screening recommended for you and due dates:  Health Maintenance  Topic Date Due   COVID-19 Vaccine (4 - Booster for [employer] series) 10/03/2022   Flu Shot  02/16/2023*   Medicare Annual Wellness Visit  10/01/2023   Mammogram  05/13/2024   Colon Cancer Screening  03/06/2030   Tetanus Vaccine  03/09/2031   Pneumonia Vaccine  Completed   DEXA scan (bone density measurement)  Completed   Hepatitis C Screening: USPSTF Recommendation to screen - Ages 39-79 yo.  Completed   Zoster (Shingles) Vaccine  Completed   HPV Vaccine  Aged Out  *Topic was postponed. The date shown is not the original due date.

## 2022-11-04 ENCOUNTER — Other Ambulatory Visit: Payer: Self-pay | Admitting: Neurology

## 2022-11-05 ENCOUNTER — Telehealth: Payer: Self-pay | Admitting: Neurology

## 2022-11-05 ENCOUNTER — Ambulatory Visit: Payer: Medicare Other | Admitting: Neurology

## 2022-11-05 ENCOUNTER — Encounter: Payer: Self-pay | Admitting: Neurology

## 2022-11-05 VITALS — BP 140/61 | HR 56 | Ht 59.0 in | Wt 124.4 lb

## 2022-11-05 DIAGNOSIS — H8110 Benign paroxysmal vertigo, unspecified ear: Secondary | ICD-10-CM | POA: Diagnosis not present

## 2022-11-05 DIAGNOSIS — R569 Unspecified convulsions: Secondary | ICD-10-CM | POA: Diagnosis not present

## 2022-11-05 MED ORDER — LEVETIRACETAM 250 MG PO TABS
ORAL_TABLET | ORAL | 4 refills | Status: DC
Start: 2022-11-05 — End: 2023-11-10

## 2022-11-05 NOTE — Progress Notes (Signed)
[facility]    Provider:  Dr Jaynee Eagles Requesting Provider: Hurshel Party MD Primary Care Provider:  Lindell Spar, MD  CC:  Seizure  11/05/2022: No seizures in 2.5 years. Doing great. The other day she got up out of the bed and the room was spinning, she has "bouts of it" throughout the years. When turn head worse. SOunds like BPPV will send for recanalith positioning teaching because she has it several times a year. Will send t [facility] and have them teach her epley maneuver so that when she has this again she can do it herself at home.  She is stable for years on low-dose keppra. Recommend staying on [place] for life. Dr. Posey Pronto can prescribe for her. RTC as needed  Patient complains of symptoms per HPI as well as the following symptoms: none . Pertinent negatives and positives per HPI. All others negative   Interval history July 27, 2020: Patient had an MRI of the brain and EEG routine that were normal, unfortunately she stopped her seizure medication and when she was last seen on May 11, 2020 she reported she had stopped her Keppra 500 twice daily, unfortunately she was then seen in the emergency room in June 23 for second seizure-like event, she was gurgling, shaking and seeming to be having trouble breathing.  The prolonged video EEG did show findings consistent with left frontotemporal sharp wave activity, consistent with an epilepsy focus, putting patient at increased risk for focal epilepsy, and we highly encouraged her to stay on her epilepsy medication.   We spoke today about several questions she had, such as can she work which I assured her she could, we discussed limitations such as not swimming alone, not driving until 6 months seizure-free, I would not recommend working at heights or doing anything at work might hurt herself or others should she have a seizure, she also asked if she had to notify work that she had seizures work with a fire her: I stated it  would be illegal for them to fire her because she had seizures and I did recommend that for her safety and other safety that she consider informing her employer about her seizures however that is completely her choice how much of her medical history to share based on the type of employment which I do not know (for example if she were a truck driver she would probably have to disclose her seizures) however I do not think that she is doing anything such as driving trucks or using heavy equipment, we discussed it probably be a good idea for her to get a medical alert bracelet, we reviewed some of them online and some of the information she might want to put on it, she did ask if this would shorten her life span and we did discuss sudden unexpected death in epilepsy patients however that is rare, she want to know if it will get worse and I could not answer that sometimes even with adequate medication she can have seizures and we will need to adjust but that is why we wait at least 6 months before she drives and that is why we put precautions on her.  I did recommend if she cannot tolerate Keppra 500 twice daily that we should consider changing her medications as 250 twice a day is not considered therapeutic however may be therapeutic in her case is hard to know, she declined, she like to stay on it, if she does have another seizure she  will agree to change medications.   HPI:  Kaitlin Jones is a 71 y.o. female here as requested by Dr. Anastasio Champion for seizure.  Past medical history hypothyroidism, hyperlipidemia, prediabetes.  I reviewed Dr. Lanice Shirts notes, patient recently had an episode of went to the emergency room approximately 3 weeks prior to her last appointment (last seen February 08, 2020) she had loss of consciousness, shaking of limbs and confusion postictally.  She was told not to drive for 6 months.  No further episodes.  CT of the head did not show any major abnormalities.  I also reviewed emergency room notes  from January 18, 2020, patient arrived by EMS after her brother who lives with her heard her gurgling, she was less responsive than usual, she had blood in her shirt and a small abrasion on the right side of her tongue, patient did not know why she was in the emergency room and could not remember anything that happened, she was a poor historian however she did attempt to answer some questions.  Brother described her seizure as tonic-clonic activity for about 1 minute then sleepiness afterwards, he had never seen her have a seizure previously, she was recently started on thyroid medication for hypothyroidism.  I reviewed labs which showed unremarkable CBC, CMP, ethanol was negative, urine did not show infection, culture showed no growth, urine rapid drug screen was negative, she was discharged home when stable.  Patient was in her usual state of health, she had been doing house work, no illness, not sick, no fever, no changes in medications, nothing unusual had happened. She laid down to rest and she doesn't remember anything, her brother who is here provides information, he heard some labored breathing and he saw her eyes were rolled back in her head, she bit her tongue, she was unconscious and had turned white, he called 911 and EMS took her to the hospital while she was fighting them and she doesn't remember.She remembers the ED. She has never had a seizure in the past. No family history of seizures. No episodes since then. No other focal neurologic deficits, associated symptoms, inciting events or modifiable factors.  Reviewed notes, labs and imaging from outside physicians, which showed:  T4 Free normal  Personally reviewed CT head 01/18/2020 and agree with the following: IMPRESSION: 1. No acute intracranial findings. 2. Features of mild chronic microvascular angiopathy and parenchymal volume loss.  Review of Systems: Patient complains of symptoms per HPI as well as the following symptoms: hand pain.  Pertinent negatives and positives per HPI. All others negative    Social History   Socioeconomic History   Marital status: Single    Spouse name: Not on file   Number of children: 0   Years of education: 16   Highest education level: Not on file  Occupational History   Occupation: nursing   Occupation: computers  Tobacco Use   Smoking status: Never   Smokeless tobacco: Never  Vaping Use   Vaping Use: Never used  Substance and Sexual Activity   Alcohol use: No   Drug use: No   Sexual activity: Never    Comment: Never  Other Topics Concern   Not on file  Social History Narrative   Never married   Lives with brother [name].   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    Caffeine: none    Social Determinants of Health   Financial Resource Strain: Low Risk  (09/20/2021)   Overall Financial Resource Strain (CARDIA)  Difficulty of Paying Living Expenses: Not hard at all  Food Insecurity: No Food Insecurity (09/20/2021)   Hunger Vital Sign    Worried About Running Out of Food in the Last Year: Never true    Ran Out of Food in the Last Year: Never true  Transportation Needs: No Transportation Needs (09/20/2021)   PRAPARE - Hydrologist (Medical): No    Lack of Transportation (Non-Medical): No  Physical Activity: Sufficiently Active (09/20/2021)   Exercise Vital Sign    Days of Exercise per Week: 7 days    Minutes of Exercise per Session: 60 min  Stress: No Stress Concern Present (09/20/2021)   [place]    Feeling of Stress : Not at all  Social Connections: Moderately Integrated (09/20/2021)   Social Connection and Isolation Panel [NHANES]    Frequency of Communication with Friends and Family: Twice a week    Frequency of Social Gatherings with Friends and Family: Twice a week    Attends Religious Services: More than 4 times per year    Active Member of Clubs or Organizations: Yes     Attends Archivist Meetings: More than 4 times per year    Marital Status: Never married  Intimate Partner Violence: Not At Risk (09/20/2021)   Humiliation, Afraid, Rape, and Kick questionnaire    Fear of Current or Ex-Partner: No    Emotionally Abused: No    Physically Abused: No    Sexually Abused: No    Family History  Problem Relation Age of Onset   Atrial fibrillation Mother    CVA Mother    Stroke Mother    Heart attack Father    Heart disease Father 46       heart attack   Other Brother        enlarge prostate   Cancer Paternal Grandmother        head and neck   Seizures Neg Hx     Past Medical History:  Diagnosis Date   Carpal tunnel syndrome on both sides 02/18/2022   HLD (hyperlipidemia) 08/10/2019   Hyperlipidemia    Hypothyroidism, adult 08/10/2019   Osteopenia 08/10/2019   Prediabetes 08/10/2019   Seizures ([place]) 02/16/2020   Vitamin D deficiency disease 08/10/2019    Patient Active Problem List   Diagnosis Date Noted   Carpal tunnel syndrome on both sides 02/18/2022   Encounter for general adult medical examination with abnormal findings 02/18/2022   Refused influenza vaccine 01/16/2022   Partial symptomatic epilepsy with complex partial seizures, not intractable, without status epilepticus ([place]) 07/30/2020   Seizures ([place]) 02/16/2020   Hypothyroidism, adult 08/10/2019   Vitamin D deficiency 08/10/2019   Prediabetes 08/10/2019   HLD (hyperlipidemia) 08/10/2019   Osteopenia 08/10/2019   Family history of premature coronary heart disease 08/26/2016    Past Surgical History:  Procedure Laterality Date   CATARACT EXTRACTION Bilateral    07/05/20 and 07/19/20   CHOLECYSTECTOMY  10/1986   COLONOSCOPY N/A 03/06/2020   Procedure: COLONOSCOPY;  Surgeon: [name], MD;  [facility]: [facility];  Service: Endoscopy;  Laterality: N/A;  8:30   POLYPECTOMY  03/06/2020   Procedure: POLYPECTOMY;  Surgeon: [name], MD;  Location: [facility];   Service: Endoscopy;;    Current Outpatient Medications  Medication Sig Dispense Refill   Calcium Carb-Cholecalciferol (CALCIUM + D3 PO) Take by mouth daily. Calcium 1200 mg     Propylene  Glycol (SYSTANE COMPLETE OP) Apply 1 drop to eye daily. Uses in both eyes     VITAMIN D PO Take by mouth.     levETIRAcetam (KEPPRA) 250 MG tablet TAKE 1 TABLET 2 (TWO) TIMES DAILY. 180 tablet 4   No current facility-administered medications for this visit.    Allergies as of 11/05/2022   (No Known Allergies)    Vitals: BP (!) 140/61   Pulse (!) 56   Ht '4\' 11"'$  (1.499 m)   Wt 124 lb 6.4 oz (56.4 kg)   LMP 11/18/2004 (Exact Date)   BMI 25.13 kg/m  Last Weight:  Wt Readings from Last 1 Encounters:  11/05/22 124 lb 6.4 oz (56.4 kg)   Last Height:   Ht Readings from Last 1 Encounters:  11/05/22 '4\' 11"'$  (1.499 m)   Exam: NAD, pleasant                  Speech:    Speech is normal; fluent and spontaneous with normal comprehension.  Cognition:    The patient is oriented to person, place, and time;     recent and remote memory intact;     language fluent;    Cranial Nerves:    The pupils are equal, round, and reactive to light.Trigeminal sensation is intact and the muscles of mastication are normal. The face is symmetric. The palate elevates in the midline. Hearing intact. Voice is normal. Shoulder shrug is normal. The tongue has normal motion without fasciculations.   Coordination:  No dysmetria  Motor Observation:    No asymmetry, no atrophy, and no involuntary movements noted. Tone:    Normal muscle tone.     Strength:    Strength is V/V in the upper and lower limbs.      Sensation: intact to LT    Assessment/Plan:  Absolutely lovely patient with an unprovoked generalized seizure, EEG did show epileptiform activity.  I recommend continuing antiepilepsy medications for life.  She cannot tolerate anything higher than Keppra 250 twice daily, I would like to have her on at least 500  twice daily but she declines and she declines changing medications at this time.  250 twice a day may be therapeutic for her is hard to know, I did warn her about repeat breakthrough seizures.  - No seizures in 2.5 years. Doing great. -Has BPPV several times a year;  The other day she got up out of the bed and the room was spinning, she has "bouts of it" throughout the years several times a year. When turn head worse. SOunds like BPPV will send for recanalith positioning teaching because she has it several times a year. Will send to  and have them teach her epley maneuver so that when she has this again she can do it herself at home. - She is stable for years on low-dose keppra. Recommend staying on [place] for life. Dr. Posey Pronto can prescribe for her. RTC as needed   Meds ordered this encounter  Medications   levETIRAcetam (KEPPRA) 250 MG tablet    Sig: TAKE 1 TABLET 2 (TWO) TIMES DAILY.    Dispense:  180 tablet    Refill:  4   Orders Placed This Encounter  Procedures   Ambulatory referral to Physical Therapy     Cc: Lindell Spar, MD,  Lindell Spar, MD  Sarina Ill, MD  [facility] [address] [place], [place] [address]  Phone [phone] Fax [phone]  I spent 30  minutes of face-to-face and non-face-to-face time with patient on the  1. Seizures ([place])   2. Benign paroxysmal positional vertigo, unspecified laterality    diagnosis.  This included previsit chart review, lab review, study review, order entry, electronic health record documentation, patient education on the different diagnostic and therapeutic options, counseling and coordination of care, risks and benefits of management, compliance, or risk factor reduction

## 2022-11-05 NOTE — Patient Instructions (Addendum)
- Send you to [facility] to learm epley aneuvers for when you get vertigo which is BPPV - Continue Keppra, Return to Dr. Posey Pronto who can refill. If he is uncomfortable doing so we can see patient over video once yearly to prescribe.  Benign Positional Vertigo Vertigo is the feeling that you or your surroundings are moving when they are not. Benign positional vertigo is the most common form of vertigo. This is usually a harmless condition (benign). This condition is positional. This means that symptoms are triggered by certain movements and positions. This condition can be dangerous if it occurs while you are doing something that could cause harm to yourself or others. This includes activities such as driving or operating machinery. What are the causes? The inner ear has fluid-filled canals that help your brain sense movement and balance. When the fluid moves, the brain receives messages about your body's position. With benign positional vertigo, calcium crystals in the inner ear break free and disturb the inner ear area. This causes your brain to receive confusing messages about your body's position. What increases the risk? You are more likely to develop this condition if: You are a woman. You are 26 years of age or older. You have recently had a head injury. You have an inner ear disease. What are the signs or symptoms? Symptoms of this condition usually happen when you move your head or your eyes in different directions. Symptoms may start suddenly and usually last for less than a minute. They include: Loss of balance and falling. Feeling like you are spinning or moving. Feeling like your surroundings are spinning or moving. Nausea and vomiting. Blurred vision. Dizziness. Involuntary eye movement (nystagmus). Symptoms can be mild and cause only minor problems, or they can be severe and interfere with daily life. Episodes of benign positional vertigo may return (recur) over time. Symptoms may  also improve over time. How is this diagnosed? This condition may be diagnosed based on: Your medical history. A physical exam of the head, neck, and ears. Positional tests to check for or stimulate vertigo. You may be asked to turn your head and change positions, such as going from sitting to lying down. A health care provider will watch for symptoms of vertigo. You may be referred to a health care provider who specializes in ear, nose, and throat problems (ENT or otolaryngologist) or a provider who specializes in disorders of the nervous system (neurologist). How is this treated?  This condition may be treated in a session in which your health care provider moves your head in specific positions to help the displaced crystals in your inner ear move. Treatment for this condition may take several sessions. Surgery may be needed in severe cases, but this is rare. In some cases, benign positional vertigo may resolve on its own in 2-4 weeks. Follow these instructions at home: Safety Move slowly. Avoid sudden body or head movements or certain positions, as told by your health care provider. Avoid driving or operating machinery until your health care provider says it is safe. Avoid doing any tasks that would be dangerous to you or others if vertigo occurs. If you have trouble walking or keeping your balance, try using a cane for stability. If you feel dizzy or unstable, sit down right away. Return to your normal activities as told by your health care provider. Ask your health care provider what activities are safe for you. General instructions Take over-the-counter and prescription medicines only as told by your health  care provider. Drink enough fluid to keep your urine pale yellow. Keep all follow-up visits. This is important. Contact a health care provider if: You have a fever. Your condition gets worse or you develop new symptoms. Your family or friends notice any behavioral changes. You have  nausea or vomiting that gets worse. You have numbness or a prickling and tingling sensation. Get help right away if you: Have difficulty speaking or moving. Are always dizzy or faint. Develop severe headaches. Have weakness in your legs or arms. Have changes in your hearing or vision. Develop a stiff neck. Develop sensitivity to light. These symptoms may represent a serious problem that is an emergency. Do not wait to see if the symptoms will go away. Get medical help right away. Call your local emergency services (911 in the [place] Do not drive yourself to the hospital. Summary Vertigo is the feeling that you or your surroundings are moving when they are not. Benign positional vertigo is the most common form of vertigo. This condition is caused by calcium crystals in the inner ear that become displaced. This causes a disturbance in an area of the inner ear that helps your brain sense movement and balance. Symptoms include loss of balance and falling, feeling that you or your surroundings are moving, nausea and vomiting, and blurred vision. This condition can be diagnosed based on symptoms, a physical exam, and positional tests. Follow safety instructions as told by your health care provider and keep all follow-up visits. This is important. This information is not intended to replace advice given to you by your health care provider. Make sure you discuss any questions you have with your health care provider. Document Revised: 10/04/2020 Document Reviewed: 10/04/2020 Elsevier Patient Education  [place].

## 2022-11-05 NOTE — Telephone Encounter (Unsigned)
Referral for physical therapy sent through [facility] to [facility].

## 2022-12-03 ENCOUNTER — Other Ambulatory Visit: Payer: Self-pay

## 2022-12-03 ENCOUNTER — Ambulatory Visit (HOSPITAL_COMMUNITY): Payer: Medicare Other | Attending: Neurology

## 2022-12-03 DIAGNOSIS — R42 Dizziness and giddiness: Secondary | ICD-10-CM | POA: Diagnosis not present

## 2022-12-03 DIAGNOSIS — H8110 Benign paroxysmal vertigo, unspecified ear: Secondary | ICD-10-CM | POA: Diagnosis not present

## 2022-12-03 NOTE — Therapy (Signed)
OUTPATIENT PHYSICAL THERAPY VESTIBULAR EVALUATION     Patient Name: Kaitlin Jones MRN: [id number] DOB:07/28/1951, 72 y.o., female Today's Date: 12/03/2022  END OF SESSION:  PT End of Session - 12/03/22 1347     Visit Number 1    Number of Visits 2    Date for PT Re-Evaluation 12/17/22    Authorization Type [employer] Medicare    Progress Note Due on Visit 10    PT Start Time 1345    PT Stop Time 1428    PT Time Calculation (min) 43 min    Activity Tolerance Patient tolerated treatment well    Behavior During Therapy WFL for tasks assessed/performed             Past Medical History:  Diagnosis Date   Carpal tunnel syndrome on both sides 02/18/2022   HLD (hyperlipidemia) 08/10/2019   Hyperlipidemia    Hypothyroidism, adult 08/10/2019   Osteopenia 08/10/2019   Prediabetes 08/10/2019   Seizures ([place]) 02/16/2020   Vitamin D deficiency disease 08/10/2019   Past Surgical History:  Procedure Laterality Date   CATARACT EXTRACTION Bilateral    07/05/20 and 07/19/20   CHOLECYSTECTOMY  10/1986   COLONOSCOPY N/A 03/06/2020   Procedure: COLONOSCOPY;  Surgeon: [name], MD;  [facility];  Service: Endoscopy;  Laterality: N/A;  8:30   POLYPECTOMY  03/06/2020   Procedure: POLYPECTOMY;  Surgeon: [name], MD;  [facility];  Service: Endoscopy;;   Patient Active Problem List   Diagnosis Date Noted   Carpal tunnel syndrome on both sides 02/18/2022   Encounter for general adult medical examination with abnormal findings 02/18/2022   Refused influenza vaccine 01/16/2022   Partial symptomatic epilepsy with complex partial seizures, not intractable, without status epilepticus ([place]) 07/30/2020   Seizures ([place]) 02/16/2020   Hypothyroidism, adult 08/10/2019   Vitamin D deficiency 08/10/2019   Prediabetes 08/10/2019   HLD (hyperlipidemia) 08/10/2019   Osteopenia 08/10/2019   Family history of premature coronary heart disease 08/26/2016    PCP: Ihor Dow,  MD REFERRING PROVIDER: Melvenia Beam, MD[facility]  REFERRING DIAG:  Diagnosis  H81.10 (ICD-10-CM) - Benign paroxysmal positional vertigo, unspecified laterality    THERAPY DIAG:  Dizziness and giddiness - Plan: PT plan of care cert/re-cert  ONSET DATE: latest spell in October  Rationale for Evaluation and Treatment: Rehabilitation  SUBJECTIVE:   SUBJECTIVE STATEMENT: I have been having dizzy spells; the last time I had a dizzy a spell, I had a head cold (October); and then sometime prior to that I got light headed when I sat up out of bed quickly or with turning my head too fast.  Has been doing an exercise a friend showed her. Pt accompanied by: self  PERTINENT HISTORY: Epilepsy; seizure (latest June 2021)  PAIN:  Are you having pain? No  PRECAUTIONS: None  WEIGHT BEARING RESTRICTIONS: No  FALLS: Has patient fallen in last 6 months? No  LIVING ENVIRONMENT: Lives with: lives with their family Lives in: House/apartment Stairs: Yes: External: 2 steps; none Has following equipment at home: shower chair, Grab bars, and removable shower head  PLOF: Independent  PATIENT GOALS: don't want to get dizzy anymore  OBJECTIVE:   DIAGNOSTIC FINDINGS:   IMPRESSION: This MRI of the brain with and without contrast shows the following: 1.   Few punctate T2/FLAIR hyperintense foci in the subcortical white matter, consistent with very minimal chronic microvascular ischemic change, typical for age. 2.   There are no acute  findings and there is a normal enhancement pattern.   Overall cognitive status: Within functional limits for tasks assessed   SENSATION: Some tingling right hand (hx of carpal tunnel on left s/p)   Cervical ROM:  WNL grossly throughout   STRENGTH: grossly wfl throughout  LOWER EXTREMITY MMT:    BED MOBILITY:  Sit to supine Complete Independence Supine to sit Complete Independence  TRANSFERS: Assistive device utilized: None   Sit to stand: Complete Independence Stand to sit: Complete Independence Chair to chair: Complete Independence GAIT: Gait pattern: WFL Distance walked: 60 ft Assistive device utilized: None Level of assistance: Complete Independence Comments: no gait disturbance  FUNCTIONAL TESTS:  5 times sit to stand: 10.89 sec  PATIENT SURVEYS:  DHI 4  VESTIBULAR ASSESSMENT:  GENERAL OBSERVATION: glasses   SYMPTOM BEHAVIOR:  Subjective history: see above  Non-Vestibular symptoms:  none  Type of dizziness: Spinning/Vertigo and Lightheadedness/Faint  Frequency: infrequently  Duration: usually brief  Aggravating factors: No known aggravating factors, Spontaneous, and Induced by motion: bending down to the ground and turning head quickly  Relieving factors:  time  Progression of symptoms: better  OCULOMOTOR EXAM:  Ocular Alignment: normal  Ocular ROM: No Limitations  Spontaneous Nystagmus: absent  Gaze-Induced Nystagmus: absent  Smooth Pursuits: intact  Saccades: intact    VESTIBULAR - OCULAR REFLEX:   Slow VOR: Normal    Head-Impulse Test: HIT Right: negative HIT Left: negative  Dynamic Visual Acuity:  not tested   POSITIONAL TESTING: Right Dix-Hallpike: no nystagmus Left Dix-Hallpike: no nystagmus  MOTION SENSITIVITY:  Motion Sensitivity Quotient Intensity: 0 = none, 1 = Lightheaded, 2 = Mild, 3 = Moderate, 4 = Severe, 5 = Vomiting  Intensity  1. Sitting to supine 0  2. Supine to L side   3. Supine to R side   4. Supine to sitting 0  5. L Hallpike-Dix 0  6. Up from L  0  7. R Hallpike-Dix 0  8. Up from R  0  9. Sitting, head tipped to L knee   10. Head up from L knee   11. Sitting, head tipped to R knee   12. Head up from R knee   13. Sitting head turns x5 1  14.Sitting head nods x5 1  15. In stance, 180 turn to L    16. In stance, 180 turn to R     OTHOSTATICS: not done   VESTIBULAR TREATMENT:                                                                                                    DATE: 12/03/22 physical therapy evaluation and HEP instruction; patient demonstrates to PT a Epley type maneuver as the exercise her friend showed her; she turns her head too much however to a full 90 degrees.     Habituation:  Brandt-Daroff: comment: and self epley Other: issued for HEP  PATIENT EDUCATION: Education details: Patient educated on exam findings, POC, scope of PT, HEP, and education on vertigo versus dizziness. Person educated: Patient Education method: Explanation, Demonstration, and Handouts Education comprehension: verbalized understanding,  returned demonstration, verbal cues required, and tactile cues required   HOME EXERCISE PROGRAM:  GOALS: Goals reviewed with patient? No   LONG TERM GOALS: Target date: 12/17/2022  patient will be independent with initial HEP  Baseline:  Goal status: INITIAL  2.  Patient will be independent in self management strategies to improve quality of life and functional outcomes.   Baseline:  Goal status: INITIAL  3.  Patient will self report 50% improvement to improve tolerance for functional activity  Baseline:  Goal status: INITIAL  4.  Patient will remain free of vertigo symptoms (room spinning) Baseline:  Goal status: INITIAL   ASSESSMENT:  CLINICAL IMPRESSION: Patient is a 72 y.o. petite female who was seen today for physical therapy evaluation and treatment for  Diagnosis  H81.10 (ICD-10-CM) - Benign paroxysmal positional vertigo, unspecified laterality  . Patient presents to physical therapy with complaint of dizziness. Patient demonstrates no significant vestibular symptoms with provocative testing but complains of lightheadedness which is negatively impacting patient ability to perform ADLs and functional mobility tasks. Patient will benefit from skilled physical therapy services to address these deficits to improve level of function with ADLs, functional mobility tasks, and  reduce risk for falls.    OBJECTIVE IMPAIRMENTS: decreased activity tolerance, decreased knowledge of condition, dizziness, and impaired perceived functional ability.   ACTIVITY LIMITATIONS: bending  PARTICIPATION LIMITATIONS:  bathing  REHAB POTENTIAL: Good  CLINICAL DECISION MAKING: Evolving/moderate complexity  EVALUATION COMPLEXITY: Moderate   PLAN:  PT FREQUENCY: 1x/week  PT DURATION: 2 weeks  PLANNED INTERVENTIONS: Therapeutic exercises, Therapeutic activity, Neuromuscular re-education, Balance training, Gait training, Patient/Family education, Self Care, Joint mobilization, Stair training, Vestibular training, Canalith repositioning, Spinal manipulation, Spinal mobilization, Manual therapy, and Re-evaluation  PLAN FOR NEXT SESSION: Review HEP and goals; will set another appointment for 2 weeks with patient instructions to cancel if she remains symptom free.   2:55 PM, 12/03/22 [place] Gayden Small Mariha Sleeper MPT [facility] [place] [phone]

## 2022-12-19 ENCOUNTER — Encounter (HOSPITAL_COMMUNITY): Payer: Medicare Other

## 2023-01-27 ENCOUNTER — Encounter: Payer: Self-pay | Admitting: *Deleted

## 2023-02-11 ENCOUNTER — Telehealth (INDEPENDENT_AMBULATORY_CARE_PROVIDER_SITE_OTHER): Payer: Self-pay | Admitting: *Deleted

## 2023-02-11 NOTE — Telephone Encounter (Signed)
  Procedure: Colonoscopy  Height: 4'11 Weight: 115lbs       Have you had a colonoscopy before?  03/07/23, Dr. Oneida Alar  Do you have family history of colon cancer?  no  Do you have a family history of polyps? yes  Previous colonoscopy with polyps removed? yes  Do you have a history colorectal cancer?   no  Are you diabetic?  no  Do you have a prosthetic or mechanical heart valve? no  Do you have a pacemaker/defibrillator?   no  Have you had endocarditis/atrial fibrillation?  no  Do you use supplemental oxygen/CPAP?  no  Have you had joint replacement within the last 12 months?  no  Do you tend to be constipated or have to use laxatives?  no   Do you have history of alcohol use? If yes, how much and how often.  no  Do you have history or are you using drugs? If yes, what do are you  using?  no  Have you ever had a stroke/heart attack?  no  Have you ever had a heart or other vascular stent placed,?no  Do you take weight loss medication? no  female patients,: have you had a hysterectomy? no                              are you post menopausal?  yes                              do you still have your menstrual cycle? no    Date of last menstrual period?   Do you take any blood-thinning medications such as: (Plavix, aspirin, Coumadin, Aggrenox, Brilinta, Xarelto, Eliquis, Pradaxa, Savaysa or Effient)? no  If yes we need the name, milligram, dosage and who is prescribing doctor:               Current Outpatient Medications  Medication Sig Dispense Refill   Calcium Carb-Cholecalciferol (CALCIUM + D3 PO) Take by mouth daily. Calcium 1200 mg     levETIRAcetam (KEPPRA) 250 MG tablet TAKE 1 TABLET 2 (TWO) TIMES DAILY. 180 tablet 4   No current facility-administered medications for this visit.    No Known Allergies

## 2023-02-17 DIAGNOSIS — E039 Hypothyroidism, unspecified: Secondary | ICD-10-CM | POA: Diagnosis not present

## 2023-02-17 DIAGNOSIS — M8580 Other specified disorders of bone density and structure, unspecified site: Secondary | ICD-10-CM | POA: Diagnosis not present

## 2023-02-17 DIAGNOSIS — I10 Essential (primary) hypertension: Secondary | ICD-10-CM | POA: Diagnosis not present

## 2023-02-17 DIAGNOSIS — E559 Vitamin D deficiency, unspecified: Secondary | ICD-10-CM | POA: Diagnosis not present

## 2023-02-17 DIAGNOSIS — E782 Mixed hyperlipidemia: Secondary | ICD-10-CM | POA: Diagnosis not present

## 2023-02-17 DIAGNOSIS — E785 Hyperlipidemia, unspecified: Secondary | ICD-10-CM | POA: Diagnosis not present

## 2023-02-18 LAB — CMP14+EGFR
ALT: 24 IU/L (ref 0–32)
AST: 31 IU/L (ref 0–40)
Albumin/Globulin Ratio: 1.6 (ref 1.2–2.2)
Albumin: 3.9 g/dL (ref 3.8–4.8)
Alkaline Phosphatase: 62 IU/L (ref 44–121)
BUN/Creatinine Ratio: 21 (ref 12–28)
BUN: 19 mg/dL (ref 8–27)
Bilirubin Total: <0.2 mg/dL (ref 0.0–1.2)
CO2: 25 mmol/L (ref 20–29)
Calcium: 9.7 mg/dL (ref 8.7–10.3)
Chloride: 106 mmol/L (ref 96–106)
Creatinine, Ser: 0.91 mg/dL (ref 0.57–1.00)
Globulin, Total: 2.5 g/dL (ref 1.5–4.5)
Glucose: 78 mg/dL (ref 70–99)
Potassium: 5 mmol/L (ref 3.5–5.2)
Sodium: 144 mmol/L (ref 134–144)
Total Protein: 6.4 g/dL (ref 6.0–8.5)
eGFR: 67 mL/min/{1.73_m2} (ref >59)

## 2023-02-18 LAB — LIPID PANEL
Chol/HDL Ratio: 3.6 ratio (ref 0.0–4.4)
Cholesterol, Total: 177 mg/dL (ref 100–199)
HDL: 49 mg/dL (ref >39)
LDL Chol Calc (NIH): 108 mg/dL — ABNORMAL HIGH (ref 0–99)
Triglycerides: 111 mg/dL (ref 0–149)
VLDL Cholesterol Cal: 20 mg/dL (ref 5–40)

## 2023-02-18 LAB — CBC WITH DIFFERENTIAL/PLATELET
Basophils Absolute: 0.1 10*3/uL (ref 0.0–0.2)
Basos: 2 %
EOS (ABSOLUTE): 0.2 10*3/uL (ref 0.0–0.4)
Eos: 5 %
Hematocrit: 40 % (ref 34.0–46.6)
Hemoglobin: 12.7 g/dL (ref 11.1–15.9)
Immature Grans (Abs): 0 10*3/uL (ref 0.0–0.1)
Immature Granulocytes: 0 %
Lymphocytes Absolute: 1.9 10*3/uL (ref 0.7–3.1)
Lymphs: 38 %
MCH: 29.6 pg (ref 26.6–33.0)
MCHC: 31.8 g/dL (ref 31.5–35.7)
MCV: 93 fL (ref 79–97)
Monocytes Absolute: 0.4 10*3/uL (ref 0.1–0.9)
Monocytes: 8 %
Neutrophils Absolute: 2.4 10*3/uL (ref 1.4–7.0)
Neutrophils: 47 %
Platelets: 311 10*3/uL (ref 150–450)
RBC: 4.29 x10E6/uL (ref 3.77–5.28)
RDW: 12.7 % (ref 11.7–15.4)
WBC: 5 10*3/uL (ref 3.4–10.8)

## 2023-02-18 LAB — VITAMIN D 25 HYDROXY (VIT D DEFICIENCY, FRACTURES): Vit D, 25-Hydroxy: 53.2 ng/mL (ref 30.0–100.0)

## 2023-02-18 LAB — TSH+FREE T4
Free T4: 0.94 ng/dL (ref 0.82–1.77)
TSH: 3.34 u[IU]/mL (ref 0.450–4.500)

## 2023-02-24 ENCOUNTER — Ambulatory Visit (INDEPENDENT_AMBULATORY_CARE_PROVIDER_SITE_OTHER): Payer: Medicare Other | Admitting: Internal Medicine

## 2023-02-24 ENCOUNTER — Encounter: Payer: Self-pay | Admitting: Internal Medicine

## 2023-02-24 VITALS — BP 124/70 | HR 68 | Ht 59.0 in | Wt 116.8 lb

## 2023-02-24 DIAGNOSIS — G40209 Localization-related (focal) (partial) symptomatic epilepsy and epileptic syndromes with complex partial seizures, not intractable, without status epilepticus: Secondary | ICD-10-CM | POA: Diagnosis not present

## 2023-02-24 DIAGNOSIS — Z0001 Encounter for general adult medical examination with abnormal findings: Secondary | ICD-10-CM

## 2023-02-24 DIAGNOSIS — M8580 Other specified disorders of bone density and structure, unspecified site: Secondary | ICD-10-CM | POA: Diagnosis not present

## 2023-02-24 DIAGNOSIS — E782 Mixed hyperlipidemia: Secondary | ICD-10-CM | POA: Diagnosis not present

## 2023-02-24 NOTE — Patient Instructions (Signed)
Please continue to take medications as prescribed.  Please continue to follow low salt diet and perform moderate exercise/walking at least 150 mins/week. 

## 2023-02-24 NOTE — Assessment & Plan Note (Signed)
Lipid profile reviewed - improved Advised to follow low-cholesterol diet for now

## 2023-02-24 NOTE — Assessment & Plan Note (Signed)
Physical exam as documented. Fasting blood tests reviewed today. Up-to-date with pneumococcal and Shingrix vaccines.

## 2023-02-24 NOTE — Progress Notes (Signed)
Established Patient Office Visit  Subjective:  Patient ID: Kaitlin Jones, female    DOB: [birth year]  Age: 72 y.o. MRN: [id number]  CC:  Chief Complaint  Patient presents with   Annual Exam    HPI Kaitlin Jones is a 72 y.o. female with past medical history of osteopenia and seizures who presents for annual physical.  She takes Keppra for history of complex partial seizures.  She follows up with Dr. Lucia Gaskins.  Does not report any recent history of seizures.  She takes calcium and vitamin D for osteopenia.    Past Medical History:  Diagnosis Date   Carpal tunnel syndrome on both sides 02/18/2022   HLD (hyperlipidemia) 08/10/2019   Hyperlipidemia    Hypothyroidism, adult 08/10/2019   Osteopenia 08/10/2019   Prediabetes 08/10/2019   Seizures 02/16/2020   Vitamin D deficiency disease 08/10/2019    Past Surgical History:  Procedure Laterality Date   CATARACT EXTRACTION Bilateral    07/05/20 and 07/19/20   CHOLECYSTECTOMY  10/1986   COLONOSCOPY N/A 03/06/2020   Procedure: COLONOSCOPY;  Surgeon: West Bali, MD;  [facility];  Service: Endoscopy;  Laterality: N/A;  8:30   POLYPECTOMY  03/06/2020   Procedure: POLYPECTOMY;  Surgeon: West Bali, MD;  [facility];  Service: Endoscopy;;    Family History  Problem Relation Age of Onset   Atrial fibrillation Mother    CVA Mother    Stroke Mother    Heart attack Father    Heart disease Father 73       heart attack   Other Brother        enlarge prostate   Cancer Paternal Grandmother        head and neck   Seizures Neg Hx     Social History   Socioeconomic History   Marital status: Single    Spouse name: Not on file   Number of children: 0   Years of education: 16   Highest education level: Not on file  Occupational History   Occupation: nursing   Occupation: computers  Tobacco Use   Smoking status: Never   Smokeless tobacco: Never  Vaping Use   Vaping Use: Never used  Substance and Sexual  Activity   Alcohol use: No   Drug use: No   Sexual activity: Never    Comment: Never  Other Topics Concern   Not on file  Social History Narrative   Never married   Lives with brother [name].   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    Caffeine: none    Social Determinants of Health   Financial Resource Strain: Low Risk  (09/20/2021)   Overall Financial Resource Strain (CARDIA)    Difficulty of Paying Living Expenses: Not hard at all  Food Insecurity: No Food Insecurity (09/20/2021)   Hunger Vital Sign    Worried About Running Out of Food in the Last Year: Never true    Ran Out of Food in the Last Year: Never true  Transportation Needs: No Transportation Needs (09/20/2021)   PRAPARE - Administrator, Civil Service (Medical): No    Lack of Transportation (Non-Medical): No  Physical Activity: Sufficiently Active (09/20/2021)   Exercise Vital Sign    Days of Exercise per Week: 7 days    Minutes of Exercise per Session: 60 min  Stress: No Stress Concern Present (09/20/2021)   [employer] of Occupational Health - Occupational Stress Questionnaire    Feeling  of Stress : Not at all  Social Connections: Moderately Integrated (09/20/2021)   Social Connection and Isolation Panel [NHANES]    Frequency of Communication with Friends and Family: Twice a week    Frequency of Social Gatherings with Friends and Family: Twice a week    Attends Religious Services: More than 4 times per year    Active Member of [employer] or Organizations: Yes    Attends Engineer, structural: More than 4 times per year    Marital Status: Never married  Intimate Partner Violence: Not At Risk (09/20/2021)   Humiliation, Afraid, Rape, and Kick questionnaire    Fear of Current or Ex-Partner: No    Emotionally Abused: No    Physically Abused: No    Sexually Abused: No    Outpatient Medications Prior to Visit  Medication Sig Dispense Refill   Calcium Carb-Cholecalciferol (CALCIUM + D3 PO) Take by  mouth daily. Calcium 1200 mg     levETIRAcetam (KEPPRA) 250 MG tablet TAKE 1 TABLET 2 (TWO) TIMES DAILY. 180 tablet 4   No facility-administered medications prior to visit.    No Known Allergies  ROS Review of Systems  Constitutional:  Negative for chills and fever.  HENT:  Negative for congestion, sinus pressure, sinus pain and sore throat.   Eyes:  Negative for pain and discharge.  Respiratory:  Negative for cough and shortness of breath.   Cardiovascular:  Negative for chest pain and palpitations.  Gastrointestinal:  Negative for abdominal pain, constipation, diarrhea, nausea and vomiting.  Endocrine: Negative for polydipsia and polyuria.  Genitourinary:  Negative for dysuria and hematuria.  Musculoskeletal:  Negative for neck pain and neck stiffness.  Skin:  Negative for rash.  Neurological:  Negative for dizziness and weakness.  Psychiatric/Behavioral:  Negative for agitation and behavioral problems.       Objective:    Physical Exam Vitals reviewed.  Constitutional:      General: She is not in acute distress.    Appearance: She is not diaphoretic.  HENT:     Head: Normocephalic and atraumatic.     Nose: Nose normal.     Mouth/Throat:     Mouth: Mucous membranes are moist.  Eyes:     General: No scleral icterus.    Extraocular Movements: Extraocular movements intact.  Cardiovascular:     Rate and Rhythm: Normal rate and regular rhythm.     Pulses: Normal pulses.     Heart sounds: Normal heart sounds. No murmur heard. Pulmonary:     Breath sounds: Normal breath sounds. No wheezing or rales.  Musculoskeletal:     Cervical back: Neck supple. No tenderness.     Right lower leg: No edema.     Left lower leg: No edema.  Skin:    General: Skin is warm.     Findings: No rash.  Neurological:     General: No focal deficit present.     Mental Status: She is alert and oriented to person, place, and time.     Sensory: No sensory deficit.     Motor: No weakness.   Psychiatric:        Mood and Affect: Mood normal.        Behavior: Behavior normal.     BP 124/70 (BP Location: Left Arm, Patient Position: Sitting, Cuff Size: Normal)   Pulse 68   Ht 4\' 11"  (1.499 m)   Wt 116 lb 12.8 oz (53 kg)   LMP 11/18/2004 (Exact Date)   SpO2  98%   BMI 23.59 kg/m  Wt Readings from Last 3 Encounters:  02/24/23 116 lb 12.8 oz (53 kg)  11/05/22 124 lb 6.4 oz (56.4 kg)  08/20/22 119 lb 9.6 oz (54.3 kg)    Lab Results  Component Value Date   TSH 3.340 02/17/2023   Lab Results  Component Value Date   WBC 5.0 02/17/2023   HGB 12.7 02/17/2023   HCT 40.0 02/17/2023   MCV 93 02/17/2023   PLT 311 02/17/2023   Lab Results  Component Value Date   NA 144 02/17/2023   K 5.0 02/17/2023   CO2 25 02/17/2023   GLUCOSE 78 02/17/2023   BUN 19 02/17/2023   CREATININE 0.91 02/17/2023   BILITOT <0.2 02/17/2023   ALKPHOS 62 02/17/2023   AST 31 02/17/2023   ALT 24 02/17/2023   PROT 6.4 02/17/2023   ALBUMIN 3.9 02/17/2023   CALCIUM 9.7 02/17/2023   ANIONGAP 11 05/08/2020   EGFR 67 02/17/2023   Lab Results  Component Value Date   CHOL 177 02/17/2023   Lab Results  Component Value Date   HDL 49 02/17/2023   Lab Results  Component Value Date   LDLCALC 108 (H) 02/17/2023   Lab Results  Component Value Date   TRIG 111 02/17/2023   Lab Results  Component Value Date   CHOLHDL 3.6 02/17/2023   Lab Results  Component Value Date   HGBA1C 5.2 11/27/2020      Assessment & Plan:   Problem List Items Addressed This Visit       Nervous and Auditory   Partial symptomatic epilepsy with complex partial seizures, not intractable, without status epilepticus    Takes Keppra, followed by Neurology No recent episodes of seizures.        Musculoskeletal and Integument   Osteopenia    Takes calcium plus vitamin D supplement Check DEXA scan      Relevant Orders   DG Bone Density     Other   HLD (hyperlipidemia)    Lipid profile reviewed -  improved Advised to follow low-cholesterol diet for now      Encounter for general adult medical examination with abnormal findings - Primary    Physical exam as documented. Fasting blood tests reviewed today. Up-to-date with pneumococcal and Shingrix vaccines.       No orders of the defined types were placed in this encounter.   Follow-up: Return in about 6 months (around 08/26/2023).    Anabel Halonutwik K Vylet Maffia, MD

## 2023-02-24 NOTE — Assessment & Plan Note (Signed)
Takes Keppra, followed by Neurology No recent episodes of seizures. 

## 2023-02-24 NOTE — Assessment & Plan Note (Addendum)
Takes calcium plus vitamin D supplement Check DEXA scan

## 2023-03-03 ENCOUNTER — Telehealth: Payer: Self-pay | Admitting: Internal Medicine

## 2023-03-03 NOTE — Telephone Encounter (Signed)
This patient left a message at front desk that she was calling about her colonoscopy

## 2023-03-03 NOTE — Telephone Encounter (Signed)
LMOVM to call back 

## 2023-03-03 NOTE — Telephone Encounter (Signed)
4 polyps in 2021. Tubular adenomas. 3 year surveillance due. ASA 2

## 2023-03-04 NOTE — Telephone Encounter (Signed)
Called pt, line rings busy. Tried again and still rings busy

## 2023-03-05 NOTE — Telephone Encounter (Signed)
Pt called and left vm to schedule procedure.  LMTRC 

## 2023-03-06 ENCOUNTER — Encounter: Payer: Self-pay | Admitting: *Deleted

## 2023-03-06 ENCOUNTER — Ambulatory Visit (HOSPITAL_COMMUNITY)
Admission: RE | Admit: 2023-03-06 | Discharge: 2023-03-06 | Disposition: A | Discharge status: Home / Self Care | Payer: Medicare Other | Source: Ambulatory Visit | Attending: Internal Medicine | Admitting: Internal Medicine

## 2023-03-06 ENCOUNTER — Other Ambulatory Visit: Payer: Self-pay | Admitting: *Deleted

## 2023-03-06 DIAGNOSIS — Z780 Asymptomatic menopausal state: Secondary | ICD-10-CM | POA: Insufficient documentation

## 2023-03-06 DIAGNOSIS — M8580 Other specified disorders of bone density and structure, unspecified site: Secondary | ICD-10-CM | POA: Diagnosis present

## 2023-03-06 DIAGNOSIS — M85851 Other specified disorders of bone density and structure, right thigh: Secondary | ICD-10-CM | POA: Diagnosis not present

## 2023-03-06 DIAGNOSIS — M8589 Other specified disorders of bone density and structure, multiple sites: Secondary | ICD-10-CM | POA: Diagnosis not present

## 2023-03-06 DIAGNOSIS — M85832 Other specified disorders of bone density and structure, left forearm: Secondary | ICD-10-CM | POA: Diagnosis not present

## 2023-03-06 MED ORDER — PEG 3350-KCL-NA BICARB-NACL 420 G PO SOLR: 4000.0000 mL | Freq: Once | ORAL | 0 refills | Status: AC

## 2023-03-06 NOTE — Telephone Encounter (Signed)
Pt has been scheduled for 04/08/23 with Dr.Carver, instructions mailed and prep sent to the pharmacy

## 2023-03-06 NOTE — Telephone Encounter (Signed)
Pt has been scheduled for 04/08/23. Instructions mailed and prep sent to the pharmacy.   [employer] PA: Notification or Prior Authorization is not required for the requested services You are not required to submit a notification/prior authorization based on the information provided. If you have general questions about the prior authorization requirements, visit [email or web address] > Clinician Resources > Advance and Admission Notification Requirements. The number above acknowledges your notification. Please write this reference number down for future reference. If you would like to request an organization determination, please call us at [phone]. Decision ID #: [id number]

## 2023-03-07 ENCOUNTER — Telehealth: Payer: Self-pay | Admitting: Internal Medicine

## 2023-03-07 NOTE — Telephone Encounter (Signed)
Spoke to patient

## 2023-03-07 NOTE — Telephone Encounter (Signed)
Pt called regards to Bone Density results.

## 2023-03-12 NOTE — Telephone Encounter (Signed)
Questionnaire from recall, no referral needed  

## 2023-04-03 ENCOUNTER — Other Ambulatory Visit (HOSPITAL_COMMUNITY): Payer: Self-pay | Admitting: Internal Medicine

## 2023-04-03 DIAGNOSIS — Z1231 Encounter for screening mammogram for malignant neoplasm of breast: Secondary | ICD-10-CM

## 2023-04-08 ENCOUNTER — Ambulatory Visit (HOSPITAL_COMMUNITY): Payer: Medicare Other | Admitting: Anesthesiology

## 2023-04-08 ENCOUNTER — Encounter (HOSPITAL_COMMUNITY): Payer: Self-pay

## 2023-04-08 ENCOUNTER — Ambulatory Visit (HOSPITAL_COMMUNITY)
Admission: RE | Admit: 2023-04-08 | Discharge: 2023-04-08 | Disposition: A | Discharge status: Home / Self Care | Payer: Medicare Other | Attending: Internal Medicine | Admitting: Internal Medicine

## 2023-04-08 ENCOUNTER — Other Ambulatory Visit: Payer: Self-pay

## 2023-04-08 ENCOUNTER — Encounter (HOSPITAL_COMMUNITY)
Admission: RE | Disposition: A | Discharge status: Home / Self Care | Payer: Self-pay | Source: Home / Self Care | Attending: Internal Medicine

## 2023-04-08 ENCOUNTER — Ambulatory Visit (HOSPITAL_BASED_OUTPATIENT_CLINIC_OR_DEPARTMENT_OTHER): Payer: Medicare Other | Admitting: Anesthesiology

## 2023-04-08 DIAGNOSIS — G40909 Epilepsy, unspecified, not intractable, without status epilepticus: Secondary | ICD-10-CM | POA: Insufficient documentation

## 2023-04-08 DIAGNOSIS — Z1211 Encounter for screening for malignant neoplasm of colon: Secondary | ICD-10-CM | POA: Diagnosis not present

## 2023-04-08 DIAGNOSIS — E039 Hypothyroidism, unspecified: Secondary | ICD-10-CM

## 2023-04-08 DIAGNOSIS — Z86010 Personal history of colonic polyps: Secondary | ICD-10-CM | POA: Insufficient documentation

## 2023-04-08 DIAGNOSIS — Z09 Encounter for follow-up examination after completed treatment for conditions other than malignant neoplasm: Secondary | ICD-10-CM | POA: Insufficient documentation

## 2023-04-08 DIAGNOSIS — K5730 Diverticulosis of large intestine without perforation or abscess without bleeding: Secondary | ICD-10-CM | POA: Diagnosis not present

## 2023-04-08 DIAGNOSIS — Z79899 Other long term (current) drug therapy: Secondary | ICD-10-CM | POA: Diagnosis not present

## 2023-04-08 DIAGNOSIS — R569 Unspecified convulsions: Secondary | ICD-10-CM | POA: Diagnosis not present

## 2023-04-08 HISTORY — PX: COLONOSCOPY WITH PROPOFOL: SHX5780

## 2023-04-08 SURGERY — COLONOSCOPY WITH PROPOFOL
Anesthesia: General

## 2023-04-08 MED ORDER — STERILE WATER FOR IRRIGATION IR SOLN
Status: DC | PRN
  Administered 2023-04-08: 60 mL

## 2023-04-08 MED ORDER — PROPOFOL 10 MG/ML IV BOLUS
INTRAVENOUS | Status: DC | PRN
  Administered 2023-04-08: 70 mg via INTRAVENOUS

## 2023-04-08 MED ORDER — EPHEDRINE SULFATE (PRESSORS) 50 MG/ML IJ SOLN
INTRAMUSCULAR | Status: DC | PRN
  Administered 2023-04-08: 10 mg via INTRAVENOUS

## 2023-04-08 MED ORDER — LACTATED RINGERS IV SOLN: INTRAVENOUS | Status: DC

## 2023-04-08 MED ORDER — PROPOFOL 500 MG/50ML IV EMUL
INTRAVENOUS | Status: DC | PRN
  Administered 2023-04-08: 125 ug/kg/min via INTRAVENOUS

## 2023-04-08 MED ORDER — LIDOCAINE HCL (CARDIAC) PF 100 MG/5ML IV SOSY
PREFILLED_SYRINGE | INTRAVENOUS | Status: DC | PRN
  Administered 2023-04-08: 80 mg via INTRAVENOUS

## 2023-04-08 MED ORDER — EPHEDRINE 5 MG/ML INJ
INTRAVENOUS | Status: AC
  Filled 2023-04-08: qty 5

## 2023-04-08 NOTE — Op Note (Signed)
[facility] Patient Name: Kaitlin Jones Procedure Date: 04/08/2023 9:03 AM MRN: [id number] Date of Birth: Dec 26, 1950 Attending MD: Hennie Duos. Marletta Lor , [place], [id number] CSN: [id number] Age: 72 Admit Type: Outpatient Procedure:                Colonoscopy Indications:              Surveillance: Personal history of adenomatous                            polyps on last colonoscopy 3 years ago Providers:                Hennie Duos. Marletta Lor, DO, Angelica Ran, Dyann Ruddle Referring MD:              Medicines:                See the Anesthesia note for documentation of the                            administered medications Complications:            No immediate complications. Estimated Blood Loss:     Estimated blood loss: none. Procedure:                Pre-Anesthesia Assessment:                           - The anesthesia plan was to use monitored                            anesthesia care (MAC).                           After obtaining informed consent, the colonoscope                            was passed under direct vision. Throughout the                            procedure, the patient's blood pressure, pulse, and                            oxygen saturations were monitored continuously. The                            [id number] ([id number]) was introduced through the                            anus and advanced to the the cecum, identified by                            appendiceal orifice and ileocecal valve. The                            colonoscopy was performed without difficulty. The                            patient tolerated  the procedure well. The quality                            of the bowel preparation was evaluated using the                            BBPS [facility] Bowel Preparation Scale) with scores                            of: Right Colon = 3, Transverse Colon = 3 and Left                            Colon = 3 (entire mucosa seen well with no residual                             staining, small fragments of stool or opaque                            liquid). The total BBPS score equals 9. Scope In: 9:19:44 AM Scope Out: 9:32:25 AM Scope Withdrawal Time: 0 hours 8 minutes 51 seconds  Total Procedure Duration: 0 hours 12 minutes 41 seconds  Findings:      A few small-mouthed diverticula were found in the sigmoid colon.      The exam was otherwise without abnormality. Impression:               - Diverticulosis in the sigmoid colon.                           - The examination was otherwise normal.                           - No specimens collected. Moderate Sedation:      Per Anesthesia Care Recommendation:           - Patient has a contact number available for                            emergencies. The signs and symptoms of potential                            delayed complications were discussed with the                            patient. Return to normal activities tomorrow.                            Written discharge instructions were provided to the                            patient.                           - Resume previous diet.                           -  Continue present medications.                           - Await pathology results.                           - Repeat colonoscopy in 5 years for surveillance                            given polyps prior.                           - Return to GI clinic PRN. Procedure Code(s):        --- Professional ---                           Z6109, Colorectal cancer screening; colonoscopy on                            individual at high risk Diagnosis Code(s):        --- Professional ---                           Z86.010, Personal history of colonic polyps                           K57.30, Diverticulosis of large intestine without                            perforation or abscess without bleeding CPT copyright 2022 American Medical Association. All rights reserved. The codes documented in this report  are preliminary and upon coder review may  be revised to meet current compliance requirements. Hennie Duos. Marletta Lor, DO Hennie Duos. Marletta Lor, DO 04/08/2023 9:35:08 AM This report has been signed electronically. Number of Addenda: 0

## 2023-04-08 NOTE — Anesthesia Preprocedure Evaluation (Addendum)
Anesthesia Evaluation  Patient identified by MRN, date of birth, ID band Patient awake    Reviewed: Allergy & Precautions, H&P , NPO status , Patient's Chart, lab work & pertinent test results  Airway Mallampati: II  TM Distance: >3 FB Neck ROM: Full    Dental  (+) Dental Advisory Given No notable dental injury:   Pulmonary neg pulmonary ROS   Pulmonary exam normal breath sounds clear to auscultation       Cardiovascular negative cardio ROS Normal cardiovascular exam Rhythm:Regular Rate:Normal     Neuro/Psych Seizures - (did not take her medication today), Well Controlled,   Neuromuscular disease  negative psych ROS   GI/Hepatic negative GI ROS, Neg liver ROS,,,  Endo/Other  Hypothyroidism    Renal/GU negative Renal ROS  negative genitourinary   Musculoskeletal negative musculoskeletal ROS (+)    Abdominal   Peds negative pediatric ROS (+)  Hematology negative hematology ROS (+)   Anesthesia Other Findings   Reproductive/Obstetrics negative OB ROS                             Anesthesia Physical Anesthesia Plan  ASA: 2  Anesthesia Plan: General   Post-op Pain Management: Minimal or no pain anticipated   Induction: Intravenous  PONV Risk Score and Plan: 1 and Treatment may vary due to age or medical condition  Airway Management Planned: Nasal Cannula and Natural Airway  Additional Equipment:   Intra-op Plan:   Post-operative Plan:   Informed Consent: I have reviewed the patients History and Physical, chart, labs and discussed the procedure including the risks, benefits and alternatives for the proposed anesthesia with the patient or authorized representative who has indicated his/her understanding and acceptance.     Dental advisory given  Plan Discussed with: CRNA and Surgeon  Anesthesia Plan Comments:        Anesthesia Quick Evaluation

## 2023-04-08 NOTE — H&P (Signed)
Primary Care Physician:  Anabel Halon, MD Primary Gastroenterologist:  Dr. Marletta Lor  Pre-Procedure History & Physical: HPI:  Kaitlin Jones is a 72 y.o. female is here for a colonoscopy to be performed for surveillance purposes, personal history of adenomatous colon polyps in 2021  Past Medical History:  Diagnosis Date   Carpal tunnel syndrome on both sides 02/18/2022   HLD (hyperlipidemia) 08/10/2019   Hyperlipidemia    Hypothyroidism, adult 08/10/2019   Osteopenia 08/10/2019   Prediabetes 08/10/2019   Seizures (HCC) 02/16/2020   Vitamin D deficiency disease 08/10/2019    Past Surgical History:  Procedure Laterality Date   CATARACT EXTRACTION Bilateral    07/05/20 and 07/19/20   CHOLECYSTECTOMY  10/1986   COLONOSCOPY N/A 03/06/2020   Procedure: COLONOSCOPY;  Surgeon: West Bali, MD;  Location: [facility];  Service: Endoscopy;  Laterality: N/A;  8:30   POLYPECTOMY  03/06/2020   Procedure: POLYPECTOMY;  Surgeon: West Bali, MD;  Location: [facility];  Service: Endoscopy;;    Prior to Admission medications   Medication Sig Start Date End Date Taking? Authorizing Provider  Calcium Carb-Cholecalciferol (CALCIUM + D3 PO) Take 1,200 mg by mouth 2 (two) times daily. Calcium 1200 mg   Yes [provider]  levETIRAcetam (KEPPRA) 250 MG tablet TAKE 1 TABLET 2 (TWO) TIMES DAILY. 11/05/22  Yes Anson Fret, MD    Allergies as of 03/06/2023   (No Known Allergies)    Family History  Problem Relation Age of Onset   Atrial fibrillation Mother    CVA Mother    Stroke Mother    Heart attack Father    Heart disease Father 61       heart attack   Other Brother        enlarge prostate   Cancer Paternal Grandmother        head and neck   Seizures Neg Hx     Social History   Socioeconomic History   Marital status: Single    Spouse name: Not on file   Number of children: 0   Years of education: 16   Highest education level: Not on file  Occupational History    Occupation: nursing   Occupation: computers  Tobacco Use   Smoking status: Never   Smokeless tobacco: Never  Vaping Use   Vaping Use: Never used  Substance and Sexual Activity   Alcohol use: No   Drug use: No   Sexual activity: Never    Comment: Never  Other Topics Concern   Not on file  Social History Narrative   Never married   Lives with brother [name].   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    Caffeine: none    Social Determinants of Health   Financial Resource Strain: Low Risk  (09/20/2021)   Overall Financial Resource Strain (CARDIA)    Difficulty of Paying Living Expenses: Not hard at all  Food Insecurity: No Food Insecurity (09/20/2021)   Hunger Vital Sign    Worried About Running Out of Food in the Last Year: Never true    Ran Out of Food in the Last Year: Never true  Transportation Needs: No Transportation Needs (09/20/2021)   PRAPARE - Administrator, Civil Service (Medical): No    Lack of Transportation (Non-Medical): No  Physical Activity: Sufficiently Active (09/20/2021)   Exercise Vital Sign    Days of Exercise per Week: 7 days    Minutes of Exercise per Session:  60 min  Stress: No Stress Concern Present (09/20/2021)   [employer] of Occupational Health - Occupational Stress Questionnaire    Feeling of Stress : Not at all  Social Connections: Moderately Integrated (09/20/2021)   Social Connection and Isolation Panel [NHANES]    Frequency of Communication with Friends and Family: Twice a week    Frequency of Social Gatherings with Friends and Family: Twice a week    Attends Religious Services: More than 4 times per year    Active Member of Clubs or Organizations: Yes    Attends Banker Meetings: More than 4 times per year    Marital Status: Never married  Intimate Partner Violence: Not At Risk (09/20/2021)   Humiliation, Afraid, Rape, and Kick questionnaire    Fear of Current or Ex-Partner: No    Emotionally Abused: No     Physically Abused: No    Sexually Abused: No    Review of Systems: See HPI, otherwise negative ROS  Physical Exam: Vital signs in last 24 hours: Temp:  [98.1 F (36.7 C)] 98.1 F (36.7 C) (05/21 0849) Pulse Rate:  [58] 58 (05/21 0849) Resp:  [16] 16 (05/21 0849) BP: (128)/(48) 128/48 (05/21 0849) SpO2:  [100 %] 100 % (05/21 0849) Weight:  [52.2 kg] 52.2 kg (05/21 0849)   General:   Alert,  Well-developed, well-nourished, pleasant and cooperative in NAD Head:  Normocephalic and atraumatic. Eyes:  Sclera clear, no icterus.   Conjunctiva pink. Ears:  Normal auditory acuity. Nose:  No deformity, discharge,  or lesions. Msk:  Symmetrical without gross deformities. Normal posture. Extremities:  Without clubbing or edema. Neurologic:  Alert and  oriented x4;  grossly normal neurologically. Skin:  Intact without significant lesions or rashes. Psych:  Alert and cooperative. Normal mood and affect.  Impression/Plan: Kaitlin Jones is here for a colonoscopy to be performed for surveillance purposes, personal history of adenomatous colon polyps in 2021  The risks of the procedure including infection, bleed, or perforation as well as benefits, limitations, alternatives and imponderables have been reviewed with the patient. Questions have been answered. All parties agreeable.

## 2023-04-08 NOTE — Anesthesia Postprocedure Evaluation (Signed)
Anesthesia Post Note  Patient: Kaitlin Jones  Procedure(s) Performed: COLONOSCOPY WITH PROPOFOL  Patient location during evaluation: Phase II Anesthesia Type: General Level of consciousness: awake and alert and oriented Pain management: pain level controlled Vital Signs Assessment: post-procedure vital signs reviewed and stable Respiratory status: spontaneous breathing, nonlabored ventilation and respiratory function stable Cardiovascular status: blood pressure returned to baseline and stable Postop Assessment: no apparent nausea or vomiting Anesthetic complications: no  No notable events documented.   Last Vitals:  Vitals:   04/08/23 0849 04/08/23 0936  BP: (!) 128/48 (!) 111/47  Pulse: (!) 58 71  Resp: 16 (!) 71  Temp: 36.7 C 36.7 C  SpO2: 100% 98%    Last Pain:  Vitals:   04/08/23 0936  TempSrc: Oral  PainSc: 0-No pain                 Braniyah Besse C Dacia Capers

## 2023-04-08 NOTE — Transfer of Care (Addendum)
Immediate Anesthesia Transfer of Care Note  Patient: Doryce Alberto  Procedure(s) Performed: COLONOSCOPY WITH PROPOFOL  Patient Location: PACU  Anesthesia Type:General  Level of Consciousness: drowsy and patient cooperative  Airway & Oxygen Therapy: Patient Spontanous Breathing  Post-op Assessment: Report given to RN and Post -op Vital signs reviewed and stable  Post vital signs: Reviewed and stable  Last Vitals:  Vitals Value Taken Time  BP 111/47 04/08/23  0936  Temp 36.7 04/08/23  0936  Pulse 71 04/08/23  0936  Resp  04/08/23  0936  SpO2 98% 04/08/23  0936    Last Pain:  Vitals:   04/08/23 0915  TempSrc:   PainSc: 0-No pain      Patients Stated Pain Goal: 8 (04/08/23 0849)  Complications: No notable events documented.

## 2023-04-08 NOTE — Discharge Instructions (Addendum)
  Colonoscopy Discharge Instructions  Read the instructions outlined below and refer to this sheet in the next few weeks. These discharge instructions provide you with general information on caring for yourself after you leave the hospital. Your doctor may also give you specific instructions. While your treatment has been planned according to the most current medical practices available, unavoidable complications occasionally occur.   ACTIVITY You may resume your regular activity, but move at a slower pace for the next 24 hours.  Take frequent rest periods for the next 24 hours.  Walking will help get rid of the air and reduce the bloated feeling in your belly (abdomen).  No driving for 24 hours (because of the medicine (anesthesia) used during the test).   Do not sign any important legal documents or operate any machinery for 24 hours (because of the anesthesia used during the test).  NUTRITION Drink plenty of fluids.  You may resume your normal diet as instructed by your doctor.  Begin with a light meal and progress to your normal diet. Heavy or fried foods are harder to digest and may make you feel sick to your stomach (nauseated).  Avoid alcoholic beverages for 24 hours or as instructed.  MEDICATIONS You may resume your normal medications unless your doctor tells you otherwise.  WHAT YOU CAN EXPECT TODAY Some feelings of bloating in the abdomen.  Passage of more gas than usual.  Spotting of blood in your stool or on the toilet paper.  IF YOU HAD POLYPS REMOVED DURING THE COLONOSCOPY: No aspirin products for 7 days or as instructed.  No alcohol for 7 days or as instructed.  Eat a soft diet for the next 24 hours.  FINDING OUT THE RESULTS OF YOUR TEST Not all test results are available during your visit. If your test results are not back during the visit, make an appointment with your caregiver to find out the results. Do not assume everything is normal if you have not heard from your  caregiver or the medical facility. It is important for you to follow up on all of your test results.  SEEK IMMEDIATE MEDICAL ATTENTION IF: You have more than a spotting of blood in your stool.  Your belly is swollen (abdominal distention).  You are nauseated or vomiting.  You have a temperature over 101.  You have abdominal pain or discomfort that is severe or gets worse throughout the day.   Your colonoscopy was relatively unremarkable.  I did not find any polyps or evidence of colon cancer.  I recommend repeating colonoscopy in 5 given history of polyps prior.   Follow-up with GI as needed.   I hope you have a great rest of your week!  Hennie Duos. Marletta Lor, D.O. Gastroenterology and Hepatology [facility]

## 2023-04-11 ENCOUNTER — Encounter (HOSPITAL_COMMUNITY): Payer: Self-pay | Admitting: Internal Medicine

## 2023-05-16 ENCOUNTER — Ambulatory Visit (HOSPITAL_COMMUNITY)
Admission: RE | Admit: 2023-05-16 | Discharge: 2023-05-16 | Disposition: A | Discharge status: Home / Self Care | Payer: Medicare Other | Source: Ambulatory Visit | Attending: Internal Medicine | Admitting: Internal Medicine

## 2023-05-16 DIAGNOSIS — Z1231 Encounter for screening mammogram for malignant neoplasm of breast: Secondary | ICD-10-CM | POA: Insufficient documentation

## 2023-06-05 DIAGNOSIS — H40013 Open angle with borderline findings, low risk, bilateral: Secondary | ICD-10-CM | POA: Diagnosis not present

## 2023-06-05 DIAGNOSIS — H43813 Vitreous degeneration, bilateral: Secondary | ICD-10-CM | POA: Diagnosis not present

## 2023-06-05 DIAGNOSIS — Z961 Presence of intraocular lens: Secondary | ICD-10-CM | POA: Diagnosis not present

## 2023-07-22 DIAGNOSIS — H40013 Open angle with borderline findings, low risk, bilateral: Secondary | ICD-10-CM | POA: Diagnosis not present

## 2023-08-18 ENCOUNTER — Telehealth: Payer: Self-pay | Admitting: Internal Medicine

## 2023-08-18 NOTE — Telephone Encounter (Signed)
Patient called needs to know Calcium Carb-Cholecalciferol (CALCIUM + D3 PO) [[id number]]  does she need to take 1 or 2 tablets a day.

## 2023-08-18 NOTE — Telephone Encounter (Signed)
Spoke to patient

## 2023-08-26 ENCOUNTER — Encounter: Payer: Self-pay | Admitting: Internal Medicine

## 2023-08-26 ENCOUNTER — Ambulatory Visit (INDEPENDENT_AMBULATORY_CARE_PROVIDER_SITE_OTHER): Payer: Medicare Other | Admitting: Internal Medicine

## 2023-08-26 VITALS — BP 117/73 | HR 76 | Ht 59.0 in | Wt 123.8 lb

## 2023-08-26 DIAGNOSIS — M8580 Other specified disorders of bone density and structure, unspecified site: Secondary | ICD-10-CM

## 2023-08-26 DIAGNOSIS — R052 Subacute cough: Secondary | ICD-10-CM | POA: Insufficient documentation

## 2023-08-26 DIAGNOSIS — E782 Mixed hyperlipidemia: Secondary | ICD-10-CM | POA: Diagnosis not present

## 2023-08-26 DIAGNOSIS — G40209 Localization-related (focal) (partial) symptomatic epilepsy and epileptic syndromes with complex partial seizures, not intractable, without status epilepticus: Secondary | ICD-10-CM

## 2023-08-26 MED ORDER — GUAIFENESIN-CODEINE 100-10 MG/5ML PO SYRP
5.0000 mL | ORAL_SOLUTION | Freq: Three times a day (TID) | ORAL | 0 refills | Status: DC | PRN
Start: 2023-08-26 — End: 2023-12-31

## 2023-08-26 NOTE — Assessment & Plan Note (Addendum)
Takes calcium plus vitamin D supplement Checked DEXA scan

## 2023-08-26 NOTE — Assessment & Plan Note (Signed)
Takes Keppra, followed by Neurology No recent episodes of seizures.

## 2023-08-26 NOTE — Assessment & Plan Note (Signed)
Lipid profile reviewed - improved Advised to follow low-cholesterol diet for now

## 2023-08-26 NOTE — Patient Instructions (Signed)
Please take Cheratussin as needed for cough.  Please continue to take medications as prescribed.  Please continue to follow heart healthy diet and perform moderate exercise/walking as tolerated.

## 2023-08-26 NOTE — Progress Notes (Signed)
Established Patient Office Visit  Subjective:  Patient ID: Kaitlin Jones, female    DOB: 1951-08-16  Age: 72 y.o. MRN: [id number]  CC:  Chief Complaint  Patient presents with   Cough    Coughing for two weeks    HPI Kaitlin Jones is a 72 y.o. female with past medical history of osteopenia and seizures who presents for f/u of her chronic medical conditions.  She has stopped taking NP thyroid as advised.  Her thyroid function test was wnl in 04/24.  She denies any fatigue, tremors or palpitations.  Her weight is about the same as compared to last visit. She denies any constipation.  She takes Keppra for history of complex partial seizures.  She follows up with Dr. Lucia Gaskins.  Does not report any recent history of seizures.  She had left likely viral URTI about 2 weeks ago, but then she had nasal congestion, sinus pressure related headache, postnasal drip, fever and chills.  Her symptoms have resolved now with OTC cold and flu medicines, except she has persistent cough with clear expectoration.  Past Medical History:  Diagnosis Date   Carpal tunnel syndrome on both sides 02/18/2022   HLD (hyperlipidemia) 08/10/2019   Hyperlipidemia    Hypothyroidism, adult 08/10/2019   Osteopenia 08/10/2019   Prediabetes 08/10/2019   Seizures (HCC) 02/16/2020   Vitamin D deficiency disease 08/10/2019    Past Surgical History:  Procedure Laterality Date   CATARACT EXTRACTION Bilateral    07/05/20 and 07/19/20   CHOLECYSTECTOMY  10/1986   COLONOSCOPY N/A 03/06/2020   Procedure: COLONOSCOPY;  Surgeon: West Bali, MD;  [facility];  Service: Endoscopy;  Laterality: N/A;  8:30   COLONOSCOPY WITH PROPOFOL N/A 04/08/2023   Procedure: COLONOSCOPY WITH PROPOFOL;  Surgeon: Lanelle Bal, DO;  [facility];  Service: Endoscopy;  Laterality: N/A;  10:00 am   POLYPECTOMY  03/06/2020   Procedure: POLYPECTOMY;  Surgeon: West Bali, MD;  [facility];  Service: Endoscopy;;     Family History  Problem Relation Age of Onset   Atrial fibrillation Mother    CVA Mother    Stroke Mother    Heart attack Father    Heart disease Father 85       heart attack   Other Brother        enlarge prostate   Cancer Paternal Grandmother        head and neck   Seizures Neg Hx     Social History   Socioeconomic History   Marital status: Single    Spouse name: Not on file   Number of children: 0   Years of education: 16   Highest education level: Not on file  Occupational History   Occupation: nursing   Occupation: computers  Tobacco Use   Smoking status: Never   Smokeless tobacco: Never  Vaping Use   Vaping status: Never Used  Substance and Sexual Activity   Alcohol use: No   Drug use: No   Sexual activity: Never    Comment: Never  Other Topics Concern   Not on file  Social History Narrative   Never married   Lives with brother [name].   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    Caffeine: none    Social Determinants of Health   Financial Resource Strain: Low Risk  (09/20/2021)   Overall Financial Resource Strain (CARDIA)    Difficulty of Paying Living Expenses: Not hard at all  Food Insecurity: No Food Insecurity (09/20/2021)   Hunger Vital Sign    Worried About Running Out of Food in the Last Year: Never true    Ran Out of Food in the Last Year: Never true  Transportation Needs: No Transportation Needs (09/20/2021)   PRAPARE - Administrator, Civil Service (Medical): No    Lack of Transportation (Non-Medical): No  Physical Activity: Sufficiently Active (09/20/2021)   Exercise Vital Sign    Days of Exercise per Week: 7 days    Minutes of Exercise per Session: 60 min  Stress: No Stress Concern Present (09/20/2021)   [employer] of Occupational Health - Occupational Stress Questionnaire    Feeling of Stress : Not at all  Social Connections: Moderately Integrated (09/20/2021)   Social Connection and Isolation Panel [NHANES]     Frequency of Communication with Friends and Family: Twice a week    Frequency of Social Gatherings with Friends and Family: Twice a week    Attends Religious Services: More than 4 times per year    Active Member of [employer] or Organizations: Yes    Attends Engineer, structural: More than 4 times per year    Marital Status: Never married  Intimate Partner Violence: Not At Risk (09/20/2021)   Humiliation, Afraid, Rape, and Kick questionnaire    Fear of Current or Ex-Partner: No    Emotionally Abused: No    Physically Abused: No    Sexually Abused: No    Outpatient Medications Prior to Visit  Medication Sig Dispense Refill   Calcium Carb-Cholecalciferol (CALCIUM + D3 PO) Take 1,200 mg by mouth 2 (two) times daily. Calcium 1200 mg     levETIRAcetam (KEPPRA) 250 MG tablet TAKE 1 TABLET 2 (TWO) TIMES DAILY. 180 tablet 4   No facility-administered medications prior to visit.    No Known Allergies  ROS Review of Systems  Constitutional:  Negative for chills and fever.  HENT:  Negative for congestion, sinus pressure, sinus pain and sore throat.   Eyes:  Negative for pain and discharge.  Respiratory:  Positive for cough. Negative for shortness of breath.   Cardiovascular:  Negative for chest pain and palpitations.  Gastrointestinal:  Negative for abdominal pain, constipation, diarrhea, nausea and vomiting.  Endocrine: Negative for polydipsia and polyuria.  Genitourinary:  Negative for dysuria and hematuria.  Musculoskeletal:  Negative for neck pain and neck stiffness.  Skin:  Negative for rash.  Neurological:  Negative for dizziness and weakness.  Psychiatric/Behavioral:  Negative for agitation and behavioral problems.       Objective:    Physical Exam Vitals reviewed.  Constitutional:      General: She is not in acute distress.    Appearance: She is not diaphoretic.  HENT:     Head: Normocephalic and atraumatic.     Nose: Nose normal.     Mouth/Throat:     Mouth:  Mucous membranes are moist.  Eyes:     General: No scleral icterus.    Extraocular Movements: Extraocular movements intact.  Cardiovascular:     Rate and Rhythm: Normal rate and regular rhythm.     Heart sounds: Normal heart sounds. No murmur heard. Pulmonary:     Breath sounds: Normal breath sounds. No wheezing or rales.  Musculoskeletal:     Cervical back: Neck supple. No tenderness.     Right lower leg: No edema.     Left lower leg: No edema.  Skin:    General: Skin is  warm.     Findings: No rash.  Neurological:     General: No focal deficit present.     Mental Status: She is alert and oriented to person, place, and time.     Sensory: No sensory deficit.     Motor: No weakness.  Psychiatric:        Mood and Affect: Mood normal.        Behavior: Behavior normal.     BP 117/73 (BP Location: Left Arm, Patient Position: Sitting, Cuff Size: Normal)   Pulse 76   Ht 4\' 11"  (1.499 m)   Wt 123 lb 12.8 oz (56.2 kg)   LMP 11/18/2004 (Exact Date)   SpO2 95%   BMI 25.00 kg/m  Wt Readings from Last 3 Encounters:  08/26/23 123 lb 12.8 oz (56.2 kg)  04/08/23 115 lb (52.2 kg)  02/24/23 116 lb 12.8 oz (53 kg)    Lab Results  Component Value Date   TSH 3.340 02/17/2023   Lab Results  Component Value Date   WBC 5.0 02/17/2023   HGB 12.7 02/17/2023   HCT 40.0 02/17/2023   MCV 93 02/17/2023   PLT 311 02/17/2023   Lab Results  Component Value Date   NA 144 02/17/2023   K 5.0 02/17/2023   CO2 25 02/17/2023   GLUCOSE 78 02/17/2023   BUN 19 02/17/2023   CREATININE 0.91 02/17/2023   BILITOT <0.2 02/17/2023   ALKPHOS 62 02/17/2023   AST 31 02/17/2023   ALT 24 02/17/2023   PROT 6.4 02/17/2023   ALBUMIN 3.9 02/17/2023   CALCIUM 9.7 02/17/2023   ANIONGAP 11 05/08/2020   EGFR 67 02/17/2023   Lab Results  Component Value Date   CHOL 177 02/17/2023   Lab Results  Component Value Date   HDL 49 02/17/2023   Lab Results  Component Value Date   LDLCALC 108 (H) 02/17/2023    Lab Results  Component Value Date   TRIG 111 02/17/2023   Lab Results  Component Value Date   CHOLHDL 3.6 02/17/2023   Lab Results  Component Value Date   HGBA1C 5.2 11/27/2020      Assessment & Plan:   Problem List Items Addressed This Visit       Nervous and Auditory   Partial symptomatic epilepsy with complex partial seizures, not intractable, without status epilepticus (HCC)    Takes Keppra, followed by Neurology No recent episodes of seizures.        Musculoskeletal and Integument   Osteopenia - Primary    Takes calcium plus vitamin D supplement Checked DEXA scan        Other   HLD (hyperlipidemia)    Lipid profile reviewed - improved Advised to follow low-cholesterol diet for now      Subacute cough    Likely from recent viral URTI Cheratussin as needed for cough      Relevant Medications   guaiFENesin-codeine (ROBITUSSIN AC) 100-10 MG/5ML syrup     Meds ordered this encounter  Medications   guaiFENesin-codeine (ROBITUSSIN AC) 100-10 MG/5ML syrup    Sig: Take 5 mLs by mouth 3 (three) times daily as needed for cough.    Dispense:  120 mL    Refill:  0    Follow-up: Return in about 6 months (around 02/24/2024) for Annual physical.    Anabel Halon, MD

## 2023-08-26 NOTE — Assessment & Plan Note (Signed)
Likely from recent viral URTI Cheratussin as needed for cough

## 2023-10-08 ENCOUNTER — Telehealth: Payer: Self-pay

## 2023-10-08 ENCOUNTER — Ambulatory Visit: Payer: Medicare Other

## 2023-10-08 VITALS — Ht 59.0 in | Wt 123.0 lb

## 2023-10-08 DIAGNOSIS — Z0000 Encounter for general adult medical examination without abnormal findings: Secondary | ICD-10-CM | POA: Diagnosis not present

## 2023-10-08 NOTE — Telephone Encounter (Signed)
Copied from CRM [id number]. Topic: Appointments - Scheduling Inquiry for Clinic >> Oct 08, 2023 10:17 AM Dennison Nancy wrote: Reason for CRM: patient had a telephone wellness visit appointment today 10/08/23 at 9:00 did not receieve a call

## 2023-10-08 NOTE — Telephone Encounter (Signed)
Duplicate message , sent to abby who is calling for awv

## 2023-10-08 NOTE — Progress Notes (Signed)
Subjective:   Kaitlin Jones is a 72 y.o. female who presents for Medicare Annual (Subsequent) preventive examination.  Visit Complete: Virtual I connected with  Kaitlin Jones on 10/08/23 by a audio enabled telemedicine application and verified that I am speaking with the correct person using two identifiers.  Patient Location: Home  Provider Location: Home Office  I discussed the limitations of evaluation and management by telemedicine. The patient expressed understanding and agreed to proceed.  Vital Signs: Because this visit was a virtual/telehealth visit, some criteria may be missing or patient reported. Any vitals not documented were not able to be obtained and vitals that have been documented are patient reported.  Cardiac Risk Factors include: advanced age (>37men, >60 women)     Objective:    Today's Vitals   10/08/23 1449  Weight: 123 lb (55.8 kg)  Height: 4\' 11"  (1.499 m)   Body mass index is 24.84 kg/m.     10/08/2023    3:27 PM 04/08/2023    8:39 AM 12/03/2022    1:51 PM 09/30/2022    9:05 AM 09/20/2021    8:33 AM 03/06/2020    7:22 AM 01/18/2020    8:25 PM  Advanced Directives  Does Patient Have a Medical Advance Directive? No No No No Yes Yes No  Type of Advance Directive     Living will;Healthcare Power of Attorney Living will   Does patient want to make changes to medical advance directive?     No - Patient declined    Copy of Healthcare Power of Attorney in Chart?     No - copy requested    Would patient like information on creating a medical advance directive? Yes (MAU/Ambulatory/Procedural Areas - Information given) No - Patient declined No - Patient declined Yes (ED - Information included in AVS)   No - Patient declined    Current Medications (verified) Outpatient Encounter Medications as of 10/08/2023  Medication Sig   Calcium Carb-Cholecalciferol (CALCIUM + D3 PO) Take 1,200 mg by mouth 2 (two) times daily. Calcium 1200 mg   guaiFENesin-codeine  (ROBITUSSIN AC) 100-10 MG/5ML syrup Take 5 mLs by mouth 3 (three) times daily as needed for cough.   levETIRAcetam (KEPPRA) 250 MG tablet TAKE 1 TABLET 2 (TWO) TIMES DAILY.   No facility-administered encounter medications on file as of 10/08/2023.    Allergies (verified) Patient has no known allergies.   History: Past Medical History:  Diagnosis Date   Carpal tunnel syndrome on both sides 02/18/2022   HLD (hyperlipidemia) 08/10/2019   Hyperlipidemia    Hypothyroidism, adult 08/10/2019   Osteopenia 08/10/2019   Prediabetes 08/10/2019   Seizures (HCC) 02/16/2020   Vitamin D deficiency disease 08/10/2019   Past Surgical History:  Procedure Laterality Date   CATARACT EXTRACTION Bilateral    07/05/20 and 07/19/20   CHOLECYSTECTOMY  10/1986   COLONOSCOPY N/A 03/06/2020   Procedure: COLONOSCOPY;  Surgeon: West Bali, MD;  [facility];  Service: Endoscopy;  Laterality: N/A;  8:30   COLONOSCOPY WITH PROPOFOL N/A 04/08/2023   Procedure: COLONOSCOPY WITH PROPOFOL;  Surgeon: Lanelle Bal, DO;  [facility];  Service: Endoscopy;  Laterality: N/A;  10:00 am   POLYPECTOMY  03/06/2020   Procedure: POLYPECTOMY;  Surgeon: West Bali, MD;  [facility];  Service: Endoscopy;;   Family History  Problem Relation Age of Onset   Atrial fibrillation Mother    CVA Mother    Stroke Mother    Heart  attack Father    Heart disease Father 33       heart attack   Other Brother        enlarge prostate   Cancer Paternal Grandmother        head and neck   Seizures Neg Hx    Social History   Socioeconomic History   Marital status: Single    Spouse name: Not on file   Number of children: 0   Years of education: 16   Highest education level: Not on file  Occupational History   Occupation: nursing   Occupation: computers  Tobacco Use   Smoking status: Never   Smokeless tobacco: Never  Vaping Use   Vaping status: Never Used  Substance and Sexual Activity    Alcohol use: No   Drug use: No   Sexual activity: Never    Comment: Never  Other Topics Concern   Not on file  Social History Narrative   Never married   Lives with brother Kaitlin Jones.   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    Caffeine: none    Social Determinants of Health   Financial Resource Strain: Low Risk  (10/08/2023)   Overall Financial Resource Strain (CARDIA)    Difficulty of Paying Living Expenses: Not hard at all  Food Insecurity: No Food Insecurity (10/08/2023)   Hunger Vital Sign    Worried About Running Out of Food in the Last Year: Never true    Ran Out of Food in the Last Year: Never true  Transportation Needs: No Transportation Needs (10/08/2023)   PRAPARE - Administrator, Civil Service (Medical): No    Lack of Transportation (Non-Medical): No  Physical Activity: Sufficiently Active (10/08/2023)   Exercise Vital Sign    Days of Exercise per Week: 5 days    Minutes of Exercise per Session: 30 min  Stress: No Stress Concern Present (10/08/2023)   [employer] of Occupational Health - Occupational Stress Questionnaire    Feeling of Stress : Not at all  Social Connections: Moderately Integrated (10/08/2023)   Social Connection and Isolation Panel [NHANES]    Frequency of Communication with Friends and Family: More than three times a week    Frequency of Social Gatherings with Friends and Family: Three times a week    Attends Religious Services: More than 4 times per year    Active Member of Clubs or Organizations: Yes    Attends Engineer, structural: More than 4 times per year    Marital Status: Never married    Tobacco Counseling Counseling given: Not Answered   Clinical Intake:  Pre-visit preparation completed: Yes  Pain : No/denies pain     Diabetes: No  How often do you need to have someone help you when you read instructions, pamphlets, or other written materials from your doctor or pharmacy?: 1 - Never  Interpreter  Needed?: No  Information entered by :: Kandis Fantasia LPN   Activities of Daily Living    10/08/2023    3:27 PM  In your present state of health, do you have any difficulty performing the following activities:  Hearing? 0  Vision? 0  Difficulty concentrating or making decisions? 0  Walking or climbing stairs? 0  Dressing or bathing? 0  Doing errands, shopping? 0  Preparing Food and eating ? N  Using the Toilet? N  In the past six months, have you accidently leaked urine? N  Do you have problems with loss  of bowel control? N  Managing your Medications? N  Managing your Finances? N  Housekeeping or managing your Housekeeping? N    Patient Care Team: Anabel Halon, MD as PCP - General (Internal Medicine) Jena Gauss Gerrit Friends, MD as Consulting Physician (Gastroenterology)  Indicate any recent Medical Services you may have received from other than [facility] providers in the past year (date may be approximate).     Assessment:   This is a routine wellness examination for [place].  Hearing/Vision screen Hearing Screening - Comments:: Denies hearing difficulties   Vision Screening - Comments:: Wears rx glasses - up to date with routine eye exams      Goals Addressed   None   Depression Screen    10/08/2023    3:07 PM 08/26/2023    2:57 PM 02/24/2023    3:01 PM 09/30/2022    9:06 AM 08/20/2022    8:07 AM 02/18/2022    8:59 AM 09/20/2021    8:34 AM  PHQ 2/9 Scores  PHQ - 2 Score 0 0 0 0 0 0 0    Fall Risk    08/26/2023    2:57 PM 02/24/2023    3:01 PM 09/30/2022    9:06 AM 08/20/2022    8:07 AM 02/18/2022    8:59 AM  Fall Risk   Falls in the past year? 0 1 0 0 0  Number falls in past yr: 0 0 0 0 0  Injury with Fall? 0 0 0 0 0  Risk for fall due to :   No Fall Risks No Fall Risks No Fall Risks  Follow up   Falls evaluation completed Falls evaluation completed Falls evaluation completed    MEDICARE RISK AT HOME: Medicare Risk at Home Any stairs in or around the home?:  No If so, are there any without handrails?: No Home free of loose throw rugs in walkways, pet beds, electrical cords, etc?: Yes Adequate lighting in your home to reduce risk of falls?: Yes Life alert?: No Use of a cane, walker or w/c?: No Grab bars in the bathroom?: Yes Shower chair or bench in shower?: No Elevated toilet seat or a handicapped toilet?: Yes  TIMED UP AND GO:  Was the test performed?  No    Cognitive Function:    09/20/2021    8:36 AM  MMSE - Mini Mental State Exam  Not completed: Unable to complete        10/08/2023    3:27 PM 09/30/2022    9:08 AM 09/20/2021    8:36 AM  6CIT Screen  What Year? 0 points 0 points 0 points  What month? 0 points 0 points 0 points  What time? 0 points 0 points 3 points  Count back from 20 0 points 0 points 0 points  Months in reverse 0 points 0 points 0 points  Repeat phrase 0 points 0 points 0 points  Total Score 0 points 0 points 3 points    Immunizations Immunization History  Administered Date(s) Administered   Janssen (J&J) SARS-COV-2 Vaccination 02/06/2020   Moderna SARS-COV2 Booster Vaccination 08/08/2022   Moderna Sars-Covid-2 Vaccination 09/11/2020, 02/16/2021   Pneumococcal Conjugate-13 04/03/2017   Pneumococcal Polysaccharide-23 02/18/2022   Tdap 03/08/2021   Zoster Recombinant(Shingrix) 09/11/2017, 01/01/2018    TDAP status: Up to date  Flu Vaccine status: Declined, Education has been provided regarding the importance of this vaccine but patient still declined. Advised may receive this vaccine at local pharmacy or Health Dept. Aware to provide  a copy of the vaccination record if obtained from local pharmacy or Health Dept. Verbalized acceptance and understanding.  Pneumococcal vaccine status: Up to date  Covid-19 vaccine status: Information provided on how to obtain vaccines.   Qualifies for Shingles Vaccine? Yes   Zostavax completed No   Shingrix Completed?: Yes  Screening Tests Health Maintenance   Topic Date Due   COVID-19 Vaccine (5 - 2023-24 season) 07/20/2023   INFLUENZA VACCINE  02/16/2024 (Originally 06/19/2023)   Medicare Annual Wellness (AWV)  10/07/2024   MAMMOGRAM  05/15/2025   DTaP/Tdap/Td (2 - Td or Tdap) 03/09/2031   Colonoscopy  04/07/2033   Pneumonia Vaccine 41+ Years old  Completed   DEXA SCAN  Completed   Hepatitis C Screening  Completed   Zoster Vaccines- Shingrix  Completed   HPV VACCINES  Aged Out    Health Maintenance  Health Maintenance Due  Topic Date Due   COVID-19 Vaccine (5 - 2023-24 season) 07/20/2023    Colorectal cancer screening: Type of screening: Colonoscopy. Completed 04/08/23. Repeat every 10 years  Mammogram status: Completed 04/20/23. Repeat every year  Bone Density status: Completed 03/06/23. Results reflect: Bone density results: OSTEOPENIA. Repeat every 2 years.  Lung Cancer Screening: (Low Dose CT Chest recommended if Age 24-80 years, 20 pack-year currently smoking OR have quit w/in 15years.) does not qualify.   Lung Cancer Screening Referral: n/a  Additional Screening:  Hepatitis C Screening: does qualify; Completed 08/10/19  Vision Screening: Recommended annual ophthalmology exams for early detection of glaucoma and other disorders of the eye. Is the patient up to date with their annual eye exam?  Yes  Who is the provider or what is the name of the office in which the patient attends annual eye exams?  If pt is not established with a provider, would they like to be referred to a provider to establish care? No .   Dental Screening: Recommended annual dental exams for proper oral hygiene  Community Resource Referral / Chronic Care Management: CRR required this visit?  No   CCM required this visit?  No     Plan:     I have personally reviewed and noted the following in the patient's chart:   Medical and social history Use of alcohol, tobacco or illicit drugs  Current medications and supplements including opioid  prescriptions. Patient is not currently taking opioid prescriptions. Functional ability and status Nutritional status Physical activity Advanced directives List of other physicians Hospitalizations, surgeries, and ER visits in previous 12 months Vitals Screenings to include cognitive, depression, and falls Referrals and appointments  In addition, I have reviewed and discussed with patient certain preventive protocols, quality metrics, and best practice recommendations. A written personalized care plan for preventive services as well as general preventive health recommendations were provided to patient.     Kandis Fantasia [place], [place]   32/44/0102   After Visit Summary: (Mail) Due to this being a telephonic visit, the after visit summary with patients personalized plan was offered to patient via mail   Nurse Notes: No concerns at this time

## 2023-10-08 NOTE — Telephone Encounter (Signed)
Copied from CRM [id number]. Topic: Appointments - Appointment Info/Confirmation >> Oct 08, 2023 10:08 AM Tiffany H wrote: Patient called to follow up on missed telemed visit at 9AM. Called CAL, advised CAL will send message to telemed provider. Possible 1 hour tat, but nothing set.   Unable to reconnect with patient. Call dropped. Attempted to reach out to patient but line was busy.   Please follow up with patient.

## 2023-10-08 NOTE — Patient Instructions (Signed)
Kaitlin Jones , Thank you for taking time to come for your Medicare Wellness Visit. I appreciate your ongoing commitment to your health goals. Please review the following plan we discussed and let me know if I can assist you in the future.   Referrals/Orders/Follow-Ups/Clinician Recommendations: Aim for 30 minutes of exercise or brisk walking, 6-8 glasses of water, and 5 servings of fruits and vegetables each day.  This is a list of the screening recommended for you and due dates:  Health Maintenance  Topic Date Due   COVID-19 Vaccine (5 - 2023-24 season) 07/20/2023   Flu Shot  02/16/2024*   Medicare Annual Wellness Visit  10/07/2024   Mammogram  05/15/2025   DTaP/Tdap/Td vaccine (2 - Td or Tdap) 03/09/2031   Colon Cancer Screening  04/07/2033   Pneumonia Vaccine  Completed   DEXA scan (bone density measurement)  Completed   Hepatitis C Screening  Completed   Zoster (Shingles) Vaccine  Completed   HPV Vaccine  Aged Out  *Topic was postponed. The date shown is not the original due date.    Advanced directives: (ACP Link)Information on Advanced Care Planning can be found at [facility] Advance Health Care Directives Advance Health Care Directives ([email or web address]   Next Medicare Annual Wellness Visit scheduled for next year: Yes

## 2023-11-06 ENCOUNTER — Other Ambulatory Visit: Payer: Self-pay | Admitting: Neurology

## 2023-11-10 ENCOUNTER — Other Ambulatory Visit: Payer: Self-pay

## 2023-11-10 ENCOUNTER — Telehealth: Payer: Self-pay

## 2023-11-10 MED ORDER — LEVETIRACETAM 250 MG PO TABS: ORAL_TABLET | ORAL | 4 refills | Status: DC

## 2023-11-10 NOTE — Telephone Encounter (Signed)
Copied from CRM [id number]. Topic: Clinical - Medication Refill >> Nov 10, 2023  9:50 AM Joanette Gula wrote: Most Recent Primary Care Visit:  Provider: Anthoney Harada  Department: [facility]  Visit Type: MEDICARE AWV, SEQUENTIAL  Date: 10/08/2023  Medication: levETIRAcetam (KEPPRA) 250 MG tablet  Has the patient contacted their pharmacy? Yes (Agent: If no, request that the patient contact the pharmacy for the refill. If patient does not wish to contact the pharmacy document the reason why and proceed with request.) (Agent: If yes, when and what did the pharmacy advise?)  Is this the correct pharmacy for this prescription? Yes If no, delete pharmacy and type the correct one.  This is the patient's preferred pharmacy:   [id number] - [place], [place] - [address] AT [facility] [address] [place] [address] Phone: [phone] Fax: [phone]  Has the prescription been filled recently? Yes  Is the patient out of the medication? Yes  Has the patient been seen for an appointment in the last year OR does the patient have an upcoming appointment? Yes  Can we respond through MyChart? No  Agent: Please be advised that Rx refills may take up to 3 business days. We ask that you follow-up with your pharmacy.

## 2023-11-10 NOTE — Telephone Encounter (Signed)
Rx refused per last office visit note, "She is stable for years on low-dose keppra. Recommend staying on Keppra for life. Dr. Allena Katz can prescribe for her. RTC as needed"

## 2023-11-10 NOTE — Telephone Encounter (Signed)
Refills sent to pharmacy. 

## 2023-12-30 ENCOUNTER — Ambulatory Visit: Payer: Self-pay | Admitting: Internal Medicine

## 2023-12-30 NOTE — Telephone Encounter (Signed)
  Chief Complaint: productive cough Symptoms: chest congestion Frequency: 2-3 weeks Pertinent Negatives: Patient denies fever, chest pain or shortness of breath Disposition: [] ED /[] Urgent Care (no appt availability in office) / [x] Appointment(In office/virtual)/ []  [facility]/ [] Home Care/ [x] Refused Recommended Disposition /[] [facility]/ []  Follow-up with PCP Additional Notes: The patient reported that she was sick with a cold 2-3 weeks ago.  She had a runny nose that was like a faucet and she took an allergy tablet to clear that up which "helped clear up her head."  Since then she still has a persistent productive cough with clear sputum.  She said she had similar symptoms last year and was prescribed something to help her cough.  She requested a prescription that could help clear her cough and the congestion in her chest.  She denied a fever, shortness of breath and chest pain.  She declined a visit stating if she has to come in for the cough, she would just wait it out until her appointment in April for her physical.  If a [facility] is sent [facility] [address]    Reason for Disposition  SEVERE coughing spells (e.g., whooping sound after coughing, vomiting after coughing)  Answer Assessment - Initial Assessment Questions 1. ONSET: "When did the cough begin?"      2-3 weeks  2. SEVERITY: "How bad is the cough today?"      Severe  3. SPUTUM: "Describe the color of your sputum" (none, dry cough; clear, white, yellow, green)     Clear  4. HEMOPTYSIS: "Are you coughing up any blood?" If so ask: "How much?" (flecks, streaks, tablespoons, etc.)     None  5. DIFFICULTY BREATHING: "Are you having difficulty breathing?" If Yes, ask: "How bad is it?" (e.g., mild, moderate, severe)    - MILD: No SOB at rest, mild SOB with walking, speaks normally in sentences, can lie down, no retractions, pulse < 100.    - MODERATE: SOB at rest, SOB with minimal exertion  and prefers to sit, cannot lie down flat, speaks in phrases, mild retractions, audible wheezing, pulse 100-120.    - SEVERE: Very SOB at rest, speaks in single words, struggling to breathe, sitting hunched forward, retractions, pulse > 120      None  6. FEVER: "Do you have a fever?" If Yes, ask: "What is your temperature, how was it measured, and when did it start?"     Cold 2-3 weeks ago but it has since resolved  10. OTHER SYMPTOMS: "Do you have any other symptoms?" (e.g., runny nose, wheezing, chest pain)       Congestion in chest  Protocols used: Cough - Acute Productive-A-AH

## 2023-12-31 ENCOUNTER — Other Ambulatory Visit: Payer: Self-pay | Admitting: Internal Medicine

## 2023-12-31 DIAGNOSIS — R052 Subacute cough: Secondary | ICD-10-CM

## 2023-12-31 MED ORDER — GUAIFENESIN-CODEINE 100-10 MG/5ML PO SOLN
5.0000 mL | Freq: Three times a day (TID) | ORAL | 0 refills | Status: DC | PRN
Start: 2023-12-31 — End: 2024-02-24

## 2023-12-31 NOTE — Telephone Encounter (Signed)
Attempted to contact pt busy dial tone

## 2024-01-01 NOTE — Telephone Encounter (Signed)
Pt informed

## 2024-02-24 ENCOUNTER — Ambulatory Visit (HOSPITAL_COMMUNITY)
Admission: RE | Admit: 2024-02-24 | Discharge: 2024-02-24 | Disposition: A | Discharge status: Home / Self Care | Source: Ambulatory Visit | Attending: Internal Medicine | Admitting: Internal Medicine

## 2024-02-24 ENCOUNTER — Encounter: Payer: Self-pay | Admitting: Internal Medicine

## 2024-02-24 ENCOUNTER — Ambulatory Visit (INDEPENDENT_AMBULATORY_CARE_PROVIDER_SITE_OTHER): Payer: Medicare Other | Admitting: Internal Medicine

## 2024-02-24 VITALS — BP 137/74 | HR 73 | Ht 59.0 in | Wt 124.8 lb

## 2024-02-24 DIAGNOSIS — Z0001 Encounter for general adult medical examination with abnormal findings: Secondary | ICD-10-CM | POA: Diagnosis not present

## 2024-02-24 DIAGNOSIS — G8929 Other chronic pain: Secondary | ICD-10-CM | POA: Insufficient documentation

## 2024-02-24 DIAGNOSIS — M7662 Achilles tendinitis, left leg: Secondary | ICD-10-CM | POA: Diagnosis not present

## 2024-02-24 DIAGNOSIS — M79672 Pain in left foot: Secondary | ICD-10-CM | POA: Diagnosis not present

## 2024-02-24 DIAGNOSIS — M2042 Other hammer toe(s) (acquired), left foot: Secondary | ICD-10-CM | POA: Diagnosis not present

## 2024-02-24 DIAGNOSIS — M19072 Primary osteoarthritis, left ankle and foot: Secondary | ICD-10-CM | POA: Diagnosis not present

## 2024-02-24 DIAGNOSIS — M7732 Calcaneal spur, left foot: Secondary | ICD-10-CM | POA: Diagnosis not present

## 2024-02-24 DIAGNOSIS — G40209 Localization-related (focal) (partial) symptomatic epilepsy and epileptic syndromes with complex partial seizures, not intractable, without status epilepticus: Secondary | ICD-10-CM | POA: Diagnosis not present

## 2024-02-24 DIAGNOSIS — M8580 Other specified disorders of bone density and structure, unspecified site: Secondary | ICD-10-CM | POA: Diagnosis not present

## 2024-02-24 DIAGNOSIS — E782 Mixed hyperlipidemia: Secondary | ICD-10-CM | POA: Diagnosis not present

## 2024-02-24 NOTE — Assessment & Plan Note (Signed)
Takes Keppra, followed by Neurology No recent episodes of seizures.

## 2024-02-24 NOTE — Assessment & Plan Note (Signed)
 Last lipid profile reviewed - improved Advised to follow low-cholesterol diet for now

## 2024-02-24 NOTE — Progress Notes (Signed)
 Established Patient Office Visit  Subjective:  Patient ID: Kaitlin Jones, female    DOB: 1951/11/15  Age: 73 y.o. MRN: [id number]  CC:  Chief Complaint  Patient presents with   Annual Exam    Cpe / 6 month f/u, pt reports sx of achilles pain on her left foot.    HPI Kaitlin Jones is a 73 y.o. female with past medical history of osteopenia and seizures who presents for f/u of her chronic medical conditions.  She takes Keppra for history of complex partial seizures.  She follows up with Dr. Lucia Gaskins.  Does not report any recent history of seizures.  She reports left heel area pain, which is worse in the morning.  Severe pain of foot leads to limping in the morning.  Denies any numbness or tingling of the feet. She has noticed improvement in her heel pain as the day progresses.  Past Medical History:  Diagnosis Date   Carpal tunnel syndrome on both sides 02/18/2022   HLD (hyperlipidemia) 08/10/2019   Hyperlipidemia    Hypothyroidism, adult 08/10/2019   Osteopenia 08/10/2019   Prediabetes 08/10/2019   Seizures (HCC) 02/16/2020   Vitamin D deficiency disease 08/10/2019    Past Surgical History:  Procedure Laterality Date   CATARACT EXTRACTION Bilateral    07/05/20 and 07/19/20   CHOLECYSTECTOMY  10/1986   COLONOSCOPY N/A 03/06/2020   Procedure: COLONOSCOPY;  Surgeon: West Bali, MD;  [facility];  Service: Endoscopy;  Laterality: N/A;  8:30   COLONOSCOPY WITH PROPOFOL N/A 04/08/2023   Procedure: COLONOSCOPY WITH PROPOFOL;  Surgeon: Lanelle Bal, DO;  [facility]: [facility];  Service: Endoscopy;  Laterality: N/A;  10:00 am   POLYPECTOMY  03/06/2020   Procedure: POLYPECTOMY;  Surgeon: West Bali, MD;  Location: [facility];  Service: Endoscopy;;    Family History  Problem Relation Age of Onset   Atrial fibrillation Mother    CVA Mother    Stroke Mother    Heart attack Father    Heart disease Father 56       heart attack   Other Brother        enlarge  prostate   Cancer Paternal Grandmother        head and neck   Seizures Neg Hx     Social History   Socioeconomic History   Marital status: Single    Spouse name: Not on file   Number of children: 0   Years of education: 16   Highest education level: Not on file  Occupational History   Occupation: nursing   Occupation: computers  Tobacco Use   Smoking status: Never   Smokeless tobacco: Never  Vaping Use   Vaping status: Never Used  Substance and Sexual Activity   Alcohol use: No   Drug use: No   Sexual activity: Never    Comment: Never  Other Topics Concern   Not on file  Social History Narrative   Never married   Lives with brother [name].   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    Caffeine: none    Social Drivers of Corporate investment banker Strain: Low Risk  (10/08/2023)   Overall Financial Resource Strain (CARDIA)    Difficulty of Paying Living Expenses: Not hard at all  Food Insecurity: No Food Insecurity (10/08/2023)   Hunger Vital Sign    Worried About Running Out of Food in the Last Year: Never true    Ran  Out of Food in the Last Year: Never true  Transportation Needs: No Transportation Needs (10/08/2023)   PRAPARE - Administrator, Civil Service (Medical): No    Lack of Transportation (Non-Medical): No  Physical Activity: Sufficiently Active (10/08/2023)   Exercise Vital Sign    Days of Exercise per Week: 5 days    Minutes of Exercise per Session: 30 min  Stress: No Stress Concern Present (10/08/2023)   [employer] of Occupational Health - Occupational Stress Questionnaire    Feeling of Stress : Not at all  Social Connections: Moderately Integrated (10/08/2023)   Social Connection and Isolation Panel [NHANES]    Frequency of Communication with Friends and Family: More than three times a week    Frequency of Social Gatherings with Friends and Family: Three times a week    Attends Religious Services: More than 4 times per year     Active Member of Clubs or Organizations: Yes    Attends Banker Meetings: More than 4 times per year    Marital Status: Never married  Intimate Partner Violence: Not At Risk (10/08/2023)   Humiliation, Afraid, Rape, and Kick questionnaire    Fear of Current or Ex-Partner: No    Emotionally Abused: No    Physically Abused: No    Sexually Abused: No    Outpatient Medications Prior to Visit  Medication Sig Dispense Refill   Calcium Carb-Cholecalciferol (CALCIUM + D3 PO) Take 1,200 mg by mouth 2 (two) times daily. Calcium 1200 mg     levETIRAcetam (KEPPRA) 250 MG tablet TAKE 1 TABLET 2 (TWO) TIMES DAILY. 180 tablet 4   guaiFENesin-codeine 100-10 MG/5ML syrup Take 5 mLs by mouth 3 (three) times daily as needed for cough. 120 mL 0   No facility-administered medications prior to visit.    No Known Allergies  ROS Review of Systems  Constitutional:  Negative for chills and fever.  HENT:  Negative for congestion, sinus pressure, sinus pain and sore throat.   Eyes:  Negative for pain and discharge.  Respiratory:  Negative for cough and shortness of breath.   Cardiovascular:  Negative for chest pain and palpitations.  Gastrointestinal:  Negative for abdominal pain, constipation, diarrhea, nausea and vomiting.  Endocrine: Negative for polydipsia and polyuria.  Genitourinary:  Negative for dysuria and hematuria.  Musculoskeletal:  Negative for neck pain and neck stiffness.       Left heel pain  Skin:  Negative for rash.  Neurological:  Negative for dizziness and weakness.  Psychiatric/Behavioral:  Negative for agitation and behavioral problems.       Objective:    Physical Exam Vitals reviewed.  Constitutional:      General: She is not in acute distress.    Appearance: She is not diaphoretic.  HENT:     Head: Normocephalic and atraumatic.     Nose: Nose normal.     Mouth/Throat:     Mouth: Mucous membranes are moist.  Eyes:     General: No scleral icterus.     Extraocular Movements: Extraocular movements intact.  Cardiovascular:     Rate and Rhythm: Normal rate and regular rhythm.     Heart sounds: Normal heart sounds. No murmur heard. Pulmonary:     Breath sounds: Normal breath sounds. No wheezing or rales.  Abdominal:     Palpations: Abdomen is soft.     Tenderness: There is no abdominal tenderness.  Musculoskeletal:     Cervical back: Neck supple. No tenderness.  Right lower leg: No edema.     Left lower leg: No edema.     Left foot: Normal range of motion. Tenderness (Around left heel area) present. No swelling.  Skin:    General: Skin is warm.     Findings: No rash.  Neurological:     General: No focal deficit present.     Mental Status: She is alert and oriented to person, place, and time.     Cranial Nerves: No cranial nerve deficit.     Sensory: No sensory deficit.     Motor: No weakness.  Psychiatric:        Mood and Affect: Mood normal.        Behavior: Behavior normal.     BP 137/74   Pulse 73   Ht 4\' 11"  (1.499 m)   Wt 124 lb 12.8 oz (56.6 kg)   LMP 11/18/2004 (Exact Date)   SpO2 98%   BMI 25.21 kg/m  Wt Readings from Last 3 Encounters:  02/24/24 124 lb 12.8 oz (56.6 kg)  10/08/23 123 lb (55.8 kg)  08/26/23 123 lb 12.8 oz (56.2 kg)    Lab Results  Component Value Date   TSH 3.340 02/17/2023   Lab Results  Component Value Date   WBC 5.0 02/17/2023   HGB 12.7 02/17/2023   HCT 40.0 02/17/2023   MCV 93 02/17/2023   PLT 311 02/17/2023   Lab Results  Component Value Date   NA 144 02/17/2023   K 5.0 02/17/2023   CO2 25 02/17/2023   GLUCOSE 78 02/17/2023   BUN 19 02/17/2023   CREATININE 0.91 02/17/2023   BILITOT <0.2 02/17/2023   ALKPHOS 62 02/17/2023   AST 31 02/17/2023   ALT 24 02/17/2023   PROT 6.4 02/17/2023   ALBUMIN 3.9 02/17/2023   CALCIUM 9.7 02/17/2023   ANIONGAP 11 05/08/2020   EGFR 67 02/17/2023   Lab Results  Component Value Date   CHOL 177 02/17/2023   Lab Results  Component  Value Date   HDL 49 02/17/2023   Lab Results  Component Value Date   LDLCALC 108 (H) 02/17/2023   Lab Results  Component Value Date   TRIG 111 02/17/2023   Lab Results  Component Value Date   CHOLHDL 3.6 02/17/2023   Lab Results  Component Value Date   HGBA1C 5.2 11/27/2020      Assessment & Plan:   Problem List Items Addressed This Visit       Nervous and Auditory   Partial symptomatic epilepsy with complex partial seizures, not intractable, without status epilepticus (HCC)   Takes Keppra, followed by Neurology No recent episodes of seizures.      Relevant Orders   TSH   CMP14+EGFR   CBC with Differential/Platelet     Musculoskeletal and Integument   Osteopenia   Takes calcium plus vitamin D supplement Checked DEXA scan      Relevant Orders   VITAMIN D 25 Hydroxy (Vit-D Deficiency, Fractures)     Other   HLD (hyperlipidemia)   Last lipid profile reviewed - improved Advised to follow low-cholesterol diet for now      Relevant Orders   Lipid panel   Encounter for general adult medical examination with abnormal findings - Primary   Physical exam as documented. Fasting blood tests ordered today. Up-to-date with pneumococcal and Shingrix vaccines.      Chronic heel pain, left   Chronic, intermittent sharp pain of the left heel - could be bone spur  vs tendonitis Check x-ray of left foot Tylenol arthritis as needed for pain Ice application and leg elevation Can use wrap bandage      Relevant Orders   DG Foot Complete Left     No orders of the defined types were placed in this encounter.   Follow-up: Return in about 6 months (around 08/25/2024).    Anabel Halon, MD

## 2024-02-24 NOTE — Assessment & Plan Note (Signed)
 Physical exam as documented. Fasting blood tests ordered today. Up-to-date with pneumococcal and Shingrix vaccines.

## 2024-02-24 NOTE — Assessment & Plan Note (Signed)
Takes calcium plus vitamin D supplement Checked DEXA scan

## 2024-02-24 NOTE — Patient Instructions (Signed)
Please continue to take medications as prescribed. ? ?Please continue to follow heart healthy diet and perform moderate exercise/walking as tolerated. ?

## 2024-02-24 NOTE — Assessment & Plan Note (Signed)
 Chronic, intermittent sharp pain of the left heel - could be bone spur vs tendonitis Check x-ray of left foot Tylenol arthritis as needed for pain Ice application and leg elevation Can use wrap bandage

## 2024-02-25 LAB — CBC WITH DIFFERENTIAL/PLATELET
Basophils Absolute: 0 10*3/uL (ref 0.0–0.2)
Basos: 1 %
EOS (ABSOLUTE): 0.2 10*3/uL (ref 0.0–0.4)
Eos: 4 %
Hematocrit: 38.9 % (ref 34.0–46.6)
Hemoglobin: 13.1 g/dL (ref 11.1–15.9)
Immature Grans (Abs): 0 10*3/uL (ref 0.0–0.1)
Immature Granulocytes: 0 %
Lymphocytes Absolute: 1.6 10*3/uL (ref 0.7–3.1)
Lymphs: 39 %
MCH: 29.9 pg (ref 26.6–33.0)
MCHC: 33.7 g/dL (ref 31.5–35.7)
MCV: 89 fL (ref 79–97)
Monocytes Absolute: 0.4 10*3/uL (ref 0.1–0.9)
Monocytes: 10 %
Neutrophils Absolute: 2 10*3/uL (ref 1.4–7.0)
Neutrophils: 46 %
Platelets: 328 10*3/uL (ref 150–450)
RBC: 4.38 x10E6/uL (ref 3.77–5.28)
RDW: 12.4 % (ref 11.7–15.4)
WBC: 4.3 10*3/uL (ref 3.4–10.8)

## 2024-02-25 LAB — LIPID PANEL
Chol/HDL Ratio: 3.4 ratio (ref 0.0–4.4)
Cholesterol, Total: 165 mg/dL (ref 100–199)
HDL: 49 mg/dL (ref >39)
LDL Chol Calc (NIH): 99 mg/dL (ref 0–99)
Triglycerides: 94 mg/dL (ref 0–149)
VLDL Cholesterol Cal: 17 mg/dL (ref 5–40)

## 2024-02-25 LAB — CMP14+EGFR
ALT: 16 IU/L (ref 0–32)
AST: 23 IU/L (ref 0–40)
Albumin: 3.8 g/dL (ref 3.8–4.8)
Alkaline Phosphatase: 56 IU/L (ref 44–121)
BUN/Creatinine Ratio: 23 (ref 12–28)
BUN: 21 mg/dL (ref 8–27)
Bilirubin Total: 0.2 mg/dL (ref 0.0–1.2)
CO2: 25 mmol/L (ref 20–29)
Calcium: 10.1 mg/dL (ref 8.7–10.3)
Chloride: 102 mmol/L (ref 96–106)
Creatinine, Ser: 0.91 mg/dL (ref 0.57–1.00)
Globulin, Total: 3 g/dL (ref 1.5–4.5)
Glucose: 82 mg/dL (ref 70–99)
Potassium: 4.5 mmol/L (ref 3.5–5.2)
Sodium: 141 mmol/L (ref 134–144)
Total Protein: 6.8 g/dL (ref 6.0–8.5)
eGFR: 67 mL/min/{1.73_m2} (ref >59)

## 2024-02-25 LAB — VITAMIN D 25 HYDROXY (VIT D DEFICIENCY, FRACTURES): Vit D, 25-Hydroxy: 66.4 ng/mL (ref 30.0–100.0)

## 2024-02-25 LAB — TSH: TSH: 0.008 u[IU]/mL — ABNORMAL LOW (ref 0.450–4.500)

## 2024-02-26 ENCOUNTER — Other Ambulatory Visit: Payer: Self-pay

## 2024-02-26 DIAGNOSIS — E039 Hypothyroidism, unspecified: Secondary | ICD-10-CM

## 2024-04-13 ENCOUNTER — Other Ambulatory Visit (HOSPITAL_COMMUNITY): Payer: Self-pay | Admitting: Internal Medicine

## 2024-04-13 DIAGNOSIS — Z1231 Encounter for screening mammogram for malignant neoplasm of breast: Secondary | ICD-10-CM

## 2024-04-15 ENCOUNTER — Other Ambulatory Visit: Payer: Self-pay | Admitting: Internal Medicine

## 2024-04-15 ENCOUNTER — Ambulatory Visit: Payer: Self-pay

## 2024-04-15 DIAGNOSIS — R052 Subacute cough: Secondary | ICD-10-CM

## 2024-04-15 MED ORDER — GUAIFENESIN-CODEINE 100-10 MG/5ML PO SOLN
5.0000 mL | Freq: Three times a day (TID) | ORAL | 0 refills | Status: AC | PRN
Start: 2024-04-15 — End: ?

## 2024-04-15 NOTE — Telephone Encounter (Signed)
  Chief Complaint: cough Symptoms: cough and runny nose Frequency: x 1 week Pertinent Negatives: Patient denies fever, sob Disposition: [] ED /[] Urgent Care (no appt availability in office) / [x] Appointment(In office/virtual)/ []  [facility]/ [] Home Care/ [x] Refused Recommended Disposition /[] [facility]/ [x]  Follow-up with PCP Additional Notes: pt states that she is having the cough again and states that she dicussed this with him in April. States cough restarted this past week. States that at times she does have clear sputum and runny nose. State that she has tried cough and allergy OCT medications and nothing is helping. States that she doesn't want to come in for an appt she just wants the just wants the medication called. [facility]   Copied from CRM [id number]. Topic: Clinical - Medication Question >> Apr 15, 2024  3:45 PM Kaitlin Jones wrote: Patient wants to know if provider can refill cough medicine that was prescribed earlier this year. She stated that the hacking cough came back. guaiFENesin -codeine  (ROBITUSSIN AC) 100-10? Reason for Disposition . [1] Continuous (nonstop) coughing interferes with work or school AND [2] no improvement using cough treatment per Care Advice  Protocols used: Cough - Acute Productive-A-AH

## 2024-04-16 NOTE — Telephone Encounter (Signed)
Pt informed

## 2024-05-17 ENCOUNTER — Ambulatory Visit (HOSPITAL_COMMUNITY)
Admission: RE | Admit: 2024-05-17 | Discharge: 2024-05-17 | Disposition: A | Discharge status: Home / Self Care | Source: Ambulatory Visit | Attending: Internal Medicine | Admitting: Internal Medicine

## 2024-05-17 DIAGNOSIS — Z1231 Encounter for screening mammogram for malignant neoplasm of breast: Secondary | ICD-10-CM | POA: Insufficient documentation

## 2024-06-14 ENCOUNTER — Telehealth: Payer: Self-pay | Admitting: Internal Medicine

## 2024-06-14 NOTE — Telephone Encounter (Signed)
 Copied from CRM [id number]. Topic: Clinical - Medication Question >> Jun 14, 2024  9:55 AM Emylou G wrote: Reason for CRM: Patient called.. wants to know how she can get defilbulator for a church.  It isn't for her but for the church for emergency purposes.  Do we know anyone?

## 2024-08-25 ENCOUNTER — Ambulatory Visit (INDEPENDENT_AMBULATORY_CARE_PROVIDER_SITE_OTHER): Admitting: Internal Medicine

## 2024-08-25 ENCOUNTER — Ambulatory Visit (HOSPITAL_COMMUNITY)
Admission: RE | Admit: 2024-08-25 | Discharge: 2024-08-25 | Disposition: A | Discharge status: Home / Self Care | Source: Ambulatory Visit | Attending: Internal Medicine | Admitting: Internal Medicine

## 2024-08-25 ENCOUNTER — Encounter: Payer: Self-pay | Admitting: Internal Medicine

## 2024-08-25 VITALS — BP 138/82 | HR 77 | Ht 59.0 in | Wt 128.8 lb

## 2024-08-25 DIAGNOSIS — G40209 Localization-related (focal) (partial) symptomatic epilepsy and epileptic syndromes with complex partial seizures, not intractable, without status epilepticus: Secondary | ICD-10-CM | POA: Diagnosis not present

## 2024-08-25 DIAGNOSIS — E782 Mixed hyperlipidemia: Secondary | ICD-10-CM

## 2024-08-25 DIAGNOSIS — R1032 Left lower quadrant pain: Secondary | ICD-10-CM | POA: Diagnosis not present

## 2024-08-25 DIAGNOSIS — M47816 Spondylosis without myelopathy or radiculopathy, lumbar region: Secondary | ICD-10-CM | POA: Diagnosis not present

## 2024-08-25 DIAGNOSIS — R7989 Other specified abnormal findings of blood chemistry: Secondary | ICD-10-CM | POA: Diagnosis not present

## 2024-08-25 MED ORDER — PREDNISONE 10 MG (21) PO TBPK: ORAL_TABLET | ORAL | 0 refills | Status: DC

## 2024-08-25 MED ORDER — CYCLOBENZAPRINE HCL 5 MG PO TABS: 5.0000 mg | ORAL_TABLET | Freq: Two times a day (BID) | ORAL | 1 refills | Status: AC | PRN

## 2024-08-25 NOTE — Assessment & Plan Note (Signed)
Takes Keppra, followed by Neurology No recent episodes of seizures.

## 2024-08-25 NOTE — Progress Notes (Unsigned)
 Established Patient Office Visit  Subjective:  Patient ID: Kaitlin Jones, female    DOB: 01-04-1951  Age: 73 y.o. MRN: [id number]  CC:  Chief Complaint  Patient presents with   Medical Management of Chronic Issues    6 month f/u    Groin Pain    Reports groin pain on lefts side , ongoing for 6 weeks.     HPI Kaitlin Jones is a 73 y.o. female with past medical history of osteopenia and seizures who presents for f/u of her chronic medical conditions.  She takes Keppra  for history of complex partial seizures.  She follows up with Dr. Ines.  Does not report any recent history of seizures.  She reports left heel area pain, which is worse in the morning.  Severe pain of foot leads to limping in the morning.  Denies any numbness or tingling of the feet. She has noticed improvement in her heel pain as the day progresses.  Past Medical History:  Diagnosis Date   Carpal tunnel syndrome on both sides 02/18/2022   HLD (hyperlipidemia) 08/10/2019   Hyperlipidemia    Hypothyroidism, adult 08/10/2019   Osteopenia 08/10/2019   Prediabetes 08/10/2019   Seizures (HCC) 02/16/2020   Vitamin D  deficiency disease 08/10/2019    Past Surgical History:  Procedure Laterality Date   CATARACT EXTRACTION Bilateral    07/05/20 and 07/19/20   CHOLECYSTECTOMY  10/1986   COLONOSCOPY N/A 03/06/2020   Procedure: COLONOSCOPY;  Surgeon: Harvey Margo CROME, MD;  [facility];  Service: Endoscopy;  Laterality: N/A;  8:30   COLONOSCOPY WITH PROPOFOL  N/A 04/08/2023   Procedure: COLONOSCOPY WITH PROPOFOL ;  Surgeon: Cindie Carlin POUR, DO;  [facility];  Service: Endoscopy;  Laterality: N/A;  10:00 am   POLYPECTOMY  03/06/2020   Procedure: POLYPECTOMY;  Surgeon: Harvey Margo CROME, MD;  [facility];  Service: Endoscopy;;    Family History  Problem Relation Age of Onset   Atrial fibrillation Mother    CVA Mother    Stroke Mother    Heart attack Father    Heart disease Father 51       heart  attack   Other Brother        enlarge prostate   Cancer Paternal Grandmother        head and neck   Seizures Neg Hx     Social History   Socioeconomic History   Marital status: Single    Spouse name: Not on file   Number of children: 0   Years of education: 16   Highest education level: Not on file  Occupational History   Occupation: nursing   Occupation: computers  Tobacco Use   Smoking status: Never   Smokeless tobacco: Never  Vaping Use   Vaping status: Never Used  Substance and Sexual Activity   Alcohol use: No   Drug use: No   Sexual activity: Never    Comment: Never  Other Topics Concern   Not on file  Social History Narrative   Never married   Lives with brother Norleen.   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    Caffeine: none    Social Drivers of Corporate investment banker Strain: Low Risk  (10/08/2023)   Overall Financial Resource Strain (CARDIA)    Difficulty of Paying Living Expenses: Not hard at all  Food Insecurity: No Food Insecurity (10/08/2023)   Hunger Vital Sign    Worried About Running Out of  Food in the Last Year: Never true    Ran Out of Food in the Last Year: Never true  Transportation Needs: No Transportation Needs (10/08/2023)   PRAPARE - Administrator, Civil Service (Medical): No    Lack of Transportation (Non-Medical): No  Physical Activity: Sufficiently Active (10/08/2023)   Exercise Vital Sign    Days of Exercise per Week: 5 days    Minutes of Exercise per Session: 30 min  Stress: No Stress Concern Present (10/08/2023)   [employer] of Occupational Health - Occupational Stress Questionnaire    Feeling of Stress : Not at all  Social Connections: Moderately Integrated (10/08/2023)   Social Connection and Isolation Panel    Frequency of Communication with Friends and Family: More than three times a week    Frequency of Social Gatherings with Friends and Family: Three times a week    Attends Religious Services: More  than 4 times per year    Active Member of Clubs or Organizations: Yes    Attends Banker Meetings: More than 4 times per year    Marital Status: Never married  Intimate Partner Violence: Not At Risk (10/08/2023)   Humiliation, Afraid, Rape, and Kick questionnaire    Fear of Current or Ex-Partner: No    Emotionally Abused: No    Physically Abused: No    Sexually Abused: No    Outpatient Medications Prior to Visit  Medication Sig Dispense Refill   Calcium Carb-Cholecalciferol (CALCIUM + D3 PO) Take 1,200 mg by mouth 2 (two) times daily. Calcium 1200 mg     guaiFENesin -codeine  100-10 MG/5ML syrup Take 5 mLs by mouth 3 (three) times daily as needed for cough. 120 mL 0   levETIRAcetam  (KEPPRA ) 250 MG tablet TAKE 1 TABLET 2 (TWO) TIMES DAILY. 180 tablet 4   No facility-administered medications prior to visit.    No Known Allergies  ROS Review of Systems  Constitutional:  Negative for chills and fever.  HENT:  Negative for congestion, sinus pressure, sinus pain and sore throat.   Eyes:  Negative for pain and discharge.  Respiratory:  Negative for cough and shortness of breath.   Cardiovascular:  Negative for chest pain and palpitations.  Gastrointestinal:  Negative for abdominal pain, constipation, diarrhea, nausea and vomiting.  Endocrine: Negative for polydipsia and polyuria.  Genitourinary:  Negative for dysuria and hematuria.  Musculoskeletal:  Negative for neck pain and neck stiffness.       Left heel pain  Skin:  Negative for rash.  Neurological:  Negative for dizziness and weakness.  Psychiatric/Behavioral:  Negative for agitation and behavioral problems.       Objective:    Physical Exam Vitals reviewed.  Constitutional:      General: She is not in acute distress.    Appearance: She is not diaphoretic.  HENT:     Head: Normocephalic and atraumatic.     Nose: Nose normal.     Mouth/Throat:     Mouth: Mucous membranes are moist.  Eyes:     General: No  scleral icterus.    Extraocular Movements: Extraocular movements intact.  Cardiovascular:     Rate and Rhythm: Normal rate and regular rhythm.     Heart sounds: Normal heart sounds. No murmur heard. Pulmonary:     Breath sounds: Normal breath sounds. No wheezing or rales.  Abdominal:     Palpations: Abdomen is soft.     Tenderness: There is no abdominal tenderness.  Musculoskeletal:  Cervical back: Neck supple. No tenderness.     Right lower leg: No edema.     Left lower leg: No edema.     Left foot: Normal range of motion. Tenderness (Around left heel area) present. No swelling.  Skin:    General: Skin is warm.     Findings: No rash.  Neurological:     General: No focal deficit present.     Mental Status: She is alert and oriented to person, place, and time.     Cranial Nerves: No cranial nerve deficit.     Sensory: No sensory deficit.     Motor: No weakness.  Psychiatric:        Mood and Affect: Mood normal.        Behavior: Behavior normal.     BP 138/82 (BP Location: Left Arm)   Pulse 77   Ht 4' 11 (1.499 m)   Wt 128 lb 12.8 oz (58.4 kg)   LMP 11/18/2004 (Exact Date)   SpO2 98%   BMI 26.01 kg/m  Wt Readings from Last 3 Encounters:  08/25/24 128 lb 12.8 oz (58.4 kg)  02/24/24 124 lb 12.8 oz (56.6 kg)  10/08/23 123 lb (55.8 kg)    Lab Results  Component Value Date   TSH 0.008 (L) 02/24/2024   Lab Results  Component Value Date   WBC 4.3 02/24/2024   HGB 13.1 02/24/2024   HCT 38.9 02/24/2024   MCV 89 02/24/2024   PLT 328 02/24/2024   Lab Results  Component Value Date   NA 141 02/24/2024   K 4.5 02/24/2024   CO2 25 02/24/2024   GLUCOSE 82 02/24/2024   BUN 21 02/24/2024   CREATININE 0.91 02/24/2024   BILITOT 0.2 02/24/2024   ALKPHOS 56 02/24/2024   AST 23 02/24/2024   ALT 16 02/24/2024   PROT 6.8 02/24/2024   ALBUMIN 3.8 02/24/2024   CALCIUM 10.1 02/24/2024   ANIONGAP 11 05/08/2020   EGFR 67 02/24/2024   Lab Results  Component Value Date    CHOL 165 02/24/2024   Lab Results  Component Value Date   HDL 49 02/24/2024   Lab Results  Component Value Date   LDLCALC 99 02/24/2024   Lab Results  Component Value Date   TRIG 94 02/24/2024   Lab Results  Component Value Date   CHOLHDL 3.4 02/24/2024   Lab Results  Component Value Date   HGBA1C 5.2 11/27/2020      Assessment & Plan:   Problem List Items Addressed This Visit   None     No orders of the defined types were placed in this encounter.   Follow-up: No follow-ups on file.    Suzzane MARLA Blanch, MD

## 2024-08-25 NOTE — Patient Instructions (Signed)
 Please start taking Prednisone as prescribed.  Please take Flexeril as needed for muscle spasms. It can cause drowsiness.  Please avoid heavy lifting and frequent bending.

## 2024-08-26 DIAGNOSIS — R1032 Left lower quadrant pain: Secondary | ICD-10-CM | POA: Insufficient documentation

## 2024-08-26 NOTE — Assessment & Plan Note (Signed)
 Last lipid profile reviewed - improved Advised to follow low-cholesterol diet for now

## 2024-08-26 NOTE — Assessment & Plan Note (Signed)
 Likely due to NP thyroid  intake She has history of oversupplemented thyroid  in the past, which was WNL after stopping NP thyroid  She has stopped taking NP thyroid  again -asymptomatic currently Recheck TSH, free T3 and T4

## 2024-08-26 NOTE — Assessment & Plan Note (Signed)
 Likely due to muscular strain, although would prefer to check x-ray of hip to rule out degenerative changes/arthritis She has tried taking ibuprofen with mild relief Sterapred taper prescribed Flexeril as needed for muscle spasms/stiffness Advised to avoid heavy lifting and frequent bending

## 2024-08-27 ENCOUNTER — Ambulatory Visit: Payer: Self-pay | Admitting: Internal Medicine

## 2024-08-27 LAB — LEVETIRACETAM LEVEL: Levetiracetam Lvl: 17 ug/mL (ref 10.0–40.0)

## 2024-08-27 LAB — TSH+T4F+T3FREE
Free T4: 1.22 ng/dL (ref 0.82–1.77)
T3, Free: 3.7 pg/mL (ref 2.0–4.4)
TSH: 0.005 u[IU]/mL — ABNORMAL LOW (ref 0.450–4.500)

## 2024-10-11 ENCOUNTER — Ambulatory Visit: Payer: Medicare Other

## 2024-10-11 ENCOUNTER — Other Ambulatory Visit: Payer: Self-pay | Admitting: Internal Medicine

## 2024-10-11 VITALS — Ht 59.0 in | Wt 123.0 lb

## 2024-10-11 DIAGNOSIS — Z0000 Encounter for general adult medical examination without abnormal findings: Secondary | ICD-10-CM | POA: Diagnosis not present

## 2024-10-11 DIAGNOSIS — Z77011 Contact with and (suspected) exposure to lead: Secondary | ICD-10-CM

## 2024-10-11 NOTE — Progress Notes (Signed)
 Chief Complaint  Patient presents with   Medicare Wellness     Subjective:   Kaitlin Jones is a 73 y.o. female who presents for a Medicare Annual Wellness Visit.  Allergies (verified) Patient has no known allergies.   History: Past Medical History:  Diagnosis Date   Carpal tunnel syndrome on both sides 02/18/2022   HLD (hyperlipidemia) 08/10/2019   Hyperlipidemia    Hypothyroidism, adult 08/10/2019   Osteopenia 08/10/2019   Prediabetes 08/10/2019   Seizures (HCC) 02/16/2020   Vitamin D  deficiency disease 08/10/2019   Past Surgical History:  Procedure Laterality Date   CATARACT EXTRACTION Bilateral    07/05/20 and 07/19/20   CHOLECYSTECTOMY  10/1986   COLONOSCOPY N/A 03/06/2020   Procedure: COLONOSCOPY;  Surgeon: Harvey Margo CROME, MD;  [facility];  Service: Endoscopy;  Laterality: N/A;  8:30   COLONOSCOPY WITH PROPOFOL  N/A 04/08/2023   Procedure: COLONOSCOPY WITH PROPOFOL ;  Surgeon: Cindie Carlin POUR, DO;  [facility];  Service: Endoscopy;  Laterality: N/A;  10:00 am   POLYPECTOMY  03/06/2020   Procedure: POLYPECTOMY;  Surgeon: Harvey Margo CROME, MD;  [facility];  Service: Endoscopy;;   Family History  Problem Relation Age of Onset   Atrial fibrillation Mother    CVA Mother    Stroke Mother    Heart attack Father    Heart disease Father 32       heart attack   Other Brother        enlarge prostate   Cancer Paternal Grandmother        head and neck   Seizures Neg Hx    Social History   Occupational History   Occupation: nursing   Occupation: computers  Tobacco Use   Smoking status: Never   Smokeless tobacco: Never  Vaping Use   Vaping status: Never Used  Substance and Sexual Activity   Alcohol use: No   Drug use: No   Sexual activity: Never    Comment: Never   Tobacco Counseling Counseling given: Yes  SDOH Screenings   Food Insecurity: No Food Insecurity (10/11/2024)  Housing: Low Risk  (10/11/2024)  Transportation Needs:  No Transportation Needs (10/11/2024)  Utilities: Not At Risk (10/11/2024)  Alcohol Screen: Low Risk  (10/08/2023)  Depression (PHQ2-9): Low Risk  (10/11/2024)  Financial Resource Strain: Low Risk  (10/08/2023)  Physical Activity: Sufficiently Active (10/11/2024)  Social Connections: Moderately Integrated (10/11/2024)  Stress: No Stress Concern Present (10/11/2024)  Tobacco Use: Low Risk  (10/11/2024)  Health Literacy: Adequate Health Literacy (10/11/2024)   See flowsheets for full screening details  Depression Screen PHQ 2 & 9 Depression Scale- Over the past 2 weeks, how often have you been bothered by any of the following problems? Little interest or pleasure in doing things: 0 Feeling down, depressed, or hopeless (PHQ Adolescent also includes...irritable): 0 PHQ-2 Total Score: 0 Trouble falling or staying asleep, or sleeping too much: 0 Feeling tired or having little energy: 0 Poor appetite or overeating (PHQ Adolescent also includes...weight loss): 0 Feeling bad about yourself - or that you are a failure or have let yourself or your family down: 0 Trouble concentrating on things, such as reading the newspaper or watching television (PHQ Adolescent also includes...like school work): 0 Moving or speaking so slowly that other people could have noticed. Or the opposite - being so fidgety or restless that you have been moving around a lot more than usual: 0 Thoughts that you would be better off dead,  or of hurting yourself in some way: 0 PHQ-9 Total Score: 0 If you checked off any problems, how difficult have these problems made it for you to do your work, take care of things at home, or get along with other people?: Not difficult at all     Goals Addressed               This Visit's Progress     Have better health (pt-stated)         Visit info / Clinical Intake: Medicare Wellness Visit Type:: Subsequent Annual Wellness Visit Persons participating in visit::  patient Medicare Wellness Visit Mode:: Telephone If telephone:: video declined Because this visit was a virtual/telehealth visit:: pt reported vitals If Telephone or Video please confirm:: I connected with the patient using audio enabled telemedicine application and verified that I am speaking with the correct person using two identifiers; I discussed the limitations of evaluation and management by telemedicine; The patient expressed understanding and agreed to proceed Patient Location:: home Provider Location:: office Information given by:: patient Interpreter Needed?: No Pre-visit prep was completed: yes AWV questionnaire completed by patient prior to visit?: no Living arrangements:: with family/others Patient's Overall Health Status Rating: very good Typical amount of pain: none Does pain affect daily life?: no Are you currently prescribed opioids?: no  Dietary Habits and Nutritional Risks How many meals a day?: (!) 1 Eats fruit and vegetables daily?: yes Most meals are obtained by: preparing own meals In the last 2 weeks, have you had any of the following?: none Diabetic:: no  Functional Status Activities of Daily Living (to include ambulation/medication): Independent Ambulation: Independent Medication Administration: Independent Home Management: Independent Manage your own finances?: yes Primary transportation is: driving Concerns about vision?: no *vision screening is required for WTM* Concerns about hearing?: no  Fall Screening Falls in the past year?: 0 Number of falls in past year: 0 Was there an injury with Fall?: 0 Fall Risk Category Calculator: 0 Patient Fall Risk Level: Low Fall Risk  Fall Risk Patient at Risk for Falls Due to: No Fall Risks Fall risk Follow up: Falls evaluation completed; Education provided; Falls prevention discussed  Home and Transportation Safety: All rugs have non-skid backing?: yes All stairs or steps have railings?: yes Grab bars in  the bathtub or shower?: yes Have non-skid surface in bathtub or shower?: yes Good home lighting?: yes Regular seat belt use?: yes Hospital stays in the last year:: no  Cognitive Assessment Difficulty concentrating, remembering, or making decisions? : no Will 6CIT or Mini Cog be Completed: no 6CIT or Mini Cog Declined: patient alert, oriented, able to answer questions appropriately and recall recent events  Reviewed/Updated  Reviewed/Updated: Reviewed All (Medical, Surgical, Family, Medications, Allergies, Care Teams, Patient Goals)        Objective:    Today's Vitals   10/11/24 0953  Weight: 123 lb (55.8 kg)  Height: 4' 11 (1.499 m)   Body mass index is 24.84 kg/m.  Current Medications (verified) Outpatient Encounter Medications as of 10/11/2024  Medication Sig   Calcium Carb-Cholecalciferol (CALCIUM + D3 PO) Take 1,200 mg by mouth 2 (two) times daily. Calcium 1200 mg   cyclobenzaprine  (FLEXERIL ) 5 MG tablet Take 1 tablet (5 mg total) by mouth 2 (two) times daily as needed.   guaiFENesin -codeine  100-10 MG/5ML syrup Take 5 mLs by mouth 3 (three) times daily as needed for cough.   levETIRAcetam  (KEPPRA ) 250 MG tablet TAKE 1 TABLET 2 (TWO) TIMES DAILY.   [DISCONTINUED] predniSONE  (  STERAPRED UNI-PAK 21 TAB) 10 MG (21) TBPK tablet Take as package instructions.   No facility-administered encounter medications on file as of 10/11/2024.   Hearing/Vision screen Hearing Screening - Comments:: Patient denies any hearing difficulties.   Vision Screening - Comments:: Patient does not have an eye doctor. A list of eye doctors has been provided to the patient.   Immunizations and Health Maintenance Health Maintenance  Topic Date Due   COVID-19 Vaccine (5 - 2025-26 season) 07/19/2024   Medicare Annual Wellness (AWV)  10/07/2024   Influenza Vaccine  02/15/2025 (Originally 06/18/2024)   Bone Density Scan  03/05/2025   Mammogram  05/17/2025   Colonoscopy  04/07/2028   DTaP/Tdap/Td (2  - Td or Tdap) 03/09/2031   Pneumococcal Vaccine: 50+ Years  Completed   Hepatitis C Screening  Completed   Zoster Vaccines- Shingrix   Completed   Meningococcal B Vaccine  Aged Out        Assessment/Plan:  This is a routine wellness examination for Kaitlin Jones.  Patient Care Team: Tobie Suzzane POUR, MD as PCP - General (Internal Medicine) Shaaron Lamar HERO, MD as Consulting Physician (Gastroenterology)  I have personally reviewed and noted the following in the patient's chart:   Medical and social history Use of alcohol, tobacco or illicit drugs  Current medications and supplements including opioid prescriptions. Functional ability and status Nutritional status Physical activity Advanced directives List of other physicians Hospitalizations, surgeries, and ER visits in previous 12 months Vitals Screenings to include cognitive, depression, and falls Referrals and appointments  No orders of the defined types were placed in this encounter.  In addition, I have reviewed and discussed with patient certain preventive protocols, quality metrics, and best practice recommendations. A written personalized care plan for preventive services as well as general preventive health recommendations were provided to patient.   Aurther Harlin, CMA   10/11/2024   Return on October 12, 2025 at 10:00am, for your yearly Medicare Wellness Visit in person.  After Visit Summary: (Mail) Due to this being a telephonic visit, the after visit summary with patients personalized plan was offered to patient via mail   Nurse Notes: wants a lab drawn for lead exposure

## 2024-10-11 NOTE — Patient Instructions (Signed)
 Ms. Deans,  Thank you for taking the time for your Medicare Wellness Visit. I appreciate your continued commitment to your health goals. Please review the care plan we discussed, and feel free to reach out if I can assist you further.  Please note that Annual Wellness Visits do not include a physical exam. Some assessments may be limited, especially if the visit was conducted virtually. If needed, we may recommend an in-person follow-up with your provider.  Ongoing Care Seeing your primary care provider every 3 to 6 months helps us  monitor your health and provide consistent, personalized care.   Referrals If a referral was made during today's visit and you haven't received any updates within two weeks, please contact the referred provider directly to check on the status.  There are several Eye Doctors in your area. Here are a few that usually accept all insurance types:  What's the difference between an ophthalmologist and an optometrist?    An ophthalmologist has a medical degree (MD or DO) and complete a four year residency in ophthalmology. They can diagnose/treat a wide range of eye conditions and can perform surgical interventions such as cataract surgery and glaucoma surgery  Optometrists have a Doctor of Optometry (OD) and complete a four year optometry residency.They focus on primary eye care such as vision exams, contact lens fittings, and treatment of common eye diseases.   [facility]  (OPHTHALMOLOGY) [address] [place], [place] [address] Phone: [phone]  [facility]  (OPHTHALMOLOGY) [address] [place] [address] [place] [address] Phone: [phone]  [facility] OFFICE)   (2 LOCATIONS LISTED BELOW)  [address] [place], [place] [address] Phone: [phone]  OR [address] [place], [place] [address] Phone: [phone]  [facility] OFFICE) (3 LOCATIONS LISTED BELOW)  [address] [place], [place] [address] Phone: [phone]  [address] [place], [place] [address] Phone: [phone]  [address] [place], [place] [address] Phone: [phone]   Recommended Screenings:  Health Maintenance  Topic Date Due   COVID-19 Vaccine (5 - 2025-26 season) 07/19/2024   Medicare Annual Wellness Visit  10/07/2024   Flu Shot  02/15/2025*   Osteoporosis screening with Bone Density Scan  03/05/2025   Breast Cancer Screening  05/17/2025   Colon Cancer Screening  04/07/2028   DTaP/Tdap/Td vaccine (2 - Td or Tdap) 03/09/2031   Pneumococcal Vaccine for age over 55  Completed   Hepatitis C Screening  Completed   Zoster (Shingles) Vaccine  Completed   Meningitis B Vaccine  Aged Out  *Topic was postponed. The date shown is not the original due date.       10/08/2023    3:27 PM  Advanced Directives  Does Patient Have a Medical Advance Directive? No  Would patient like information on creating a medical advance directive? Yes (MAU/Ambulatory/Procedural Areas - Information given)    Vision: Annual vision screenings are recommended for early detection of glaucoma, cataracts, and diabetic retinopathy. These exams can also reveal signs of chronic conditions such as diabetes and high blood pressure.  Dental: Annual dental screenings help detect early signs of oral cancer, gum disease, and other conditions linked to overall health, including heart disease and diabetes.  Please see the attached documents for additional preventive care recommendations.

## 2024-11-13 ENCOUNTER — Other Ambulatory Visit: Payer: Self-pay | Admitting: Internal Medicine

## 2024-11-29 ENCOUNTER — Other Ambulatory Visit: Payer: Self-pay | Admitting: Internal Medicine

## 2024-11-29 ENCOUNTER — Ambulatory Visit: Payer: Self-pay

## 2024-11-29 DIAGNOSIS — M25562 Pain in left knee: Secondary | ICD-10-CM

## 2024-11-29 MED ORDER — PREDNISONE 20 MG PO TABS: 40.0000 mg | ORAL_TABLET | Freq: Every day | ORAL | 0 refills | Status: AC

## 2024-11-29 NOTE — Telephone Encounter (Signed)
 Pt informed

## 2024-11-29 NOTE — Telephone Encounter (Signed)
 FYI Only or Action Required?: Action required by provider: request for appointment and medication request.  Patient was last seen in primary care on 08/25/2024 by Tobie Suzzane POUR, MD.  Called Nurse Triage reporting Knee Pain.  Symptoms began several days ago.  Interventions attempted: OTC medications: ibuprofen and ASA and Prescription medications: Flexeril .  Symptoms are: unchanged.  Triage Disposition: See HCP Within 4 Hours (Or PCP Triage)  Patient/caregiver understands and will follow disposition?: No, refuses disposition                                  1. LOCATION and RADIATION: Where is the pain located?      Left knee cap 3. SEVERITY: How bad is the pain? What does it keep you from doing?   (Scale 1-10; or mild, moderate, severe)     Rates pain a 20 4. ONSET: When did the pain start? Does it come and go, or is it there all the time?     Friday 8. ASSOCIATED SYMPTOMS: Is there any swelling or redness of the knee?     States there may be slight swelling, knee is warm to touch, denies redness 9. OTHER SYMPTOMS: Do you have any other symptoms? (e.g., calf pain, chest pain, difficulty breathing, fever)     Denies loss of sensation, denies headache, denies facial drooping, denies blurred vision, denies dizziness, denies speech changes, denies chest pain, denies difficulty breathing, denies fever    This RN advised in-person evaluation today. No availability with PCP office. This RN offered availability with alternate office within region. Patient declined and stated she only wanted to be seen in PCP office or have a medication sent in. Please advise   Reason for Disposition  [1] SEVERE pain (e.g., excruciating, unable to walk) AND [2] not improved after 2 hours of pain medicine  Protocols used: Knee Pain-A-AH  Copied from CRM #[id number]. Topic: Clinical - Red Word Triage >> Nov 29, 2024 11:27 AM Hadassah PARAS wrote: Red Word that prompted  transfer to Nurse Triage: Pt is experiencing excruciating pain in left knee. Pt has fell 3 times from putting pressure on leg/knee. Pt was previously presicribed medication but it is no longer working. Transferred to NT

## 2025-02-28 ENCOUNTER — Ambulatory Visit: Admitting: Internal Medicine

## 2025-10-12 ENCOUNTER — Ambulatory Visit
